# Patient Record
Sex: Female | Born: 1975 | Race: Black or African American | Hispanic: No | Marital: Single | Smoking: Never smoker
Health system: Southern US, Community
[De-identification: ages and names within clinical notes are randomized; demographics above are authoritative.]

## PROBLEM LIST (undated history)

## (undated) DIAGNOSIS — I214 Non-ST elevation (NSTEMI) myocardial infarction: Secondary | ICD-10-CM

## (undated) DIAGNOSIS — F419 Anxiety disorder, unspecified: Secondary | ICD-10-CM

## (undated) DIAGNOSIS — I10 Essential (primary) hypertension: Secondary | ICD-10-CM

## (undated) DIAGNOSIS — R7303 Prediabetes: Secondary | ICD-10-CM

## (undated) DIAGNOSIS — E7800 Pure hypercholesterolemia, unspecified: Secondary | ICD-10-CM

## (undated) HISTORY — PX: CORONARY ANGIOPLASTY WITH STENT PLACEMENT: SHX49

---

## 1997-07-11 ENCOUNTER — Ambulatory Visit (HOSPITAL_COMMUNITY): Admission: RE | Admit: 1997-07-11 | Discharge: 1997-07-11 | Payer: Self-pay | Admitting: Pulmonary Disease

## 1997-10-26 ENCOUNTER — Other Ambulatory Visit: Admission: RE | Admit: 1997-10-26 | Discharge: 1997-10-26 | Payer: Self-pay | Admitting: Pulmonary Disease

## 1997-12-05 ENCOUNTER — Encounter: Payer: Self-pay | Admitting: Pulmonary Disease

## 1997-12-05 ENCOUNTER — Ambulatory Visit (HOSPITAL_COMMUNITY): Admission: RE | Admit: 1997-12-05 | Discharge: 1997-12-05 | Payer: Self-pay | Admitting: Pulmonary Disease

## 2003-08-19 ENCOUNTER — Emergency Department (HOSPITAL_COMMUNITY): Admission: EM | Admit: 2003-08-19 | Discharge: 2003-08-19 | Payer: Self-pay | Admitting: Emergency Medicine

## 2006-12-25 ENCOUNTER — Emergency Department (HOSPITAL_COMMUNITY): Admission: EM | Admit: 2006-12-25 | Discharge: 2006-12-26 | Payer: Self-pay | Admitting: Emergency Medicine

## 2009-08-05 ENCOUNTER — Ambulatory Visit: Payer: Self-pay | Admitting: Psychology

## 2011-12-14 ENCOUNTER — Encounter (HOSPITAL_BASED_OUTPATIENT_CLINIC_OR_DEPARTMENT_OTHER): Payer: Self-pay | Admitting: *Deleted

## 2011-12-14 ENCOUNTER — Emergency Department (HOSPITAL_BASED_OUTPATIENT_CLINIC_OR_DEPARTMENT_OTHER)
Admission: EM | Admit: 2011-12-14 | Discharge: 2011-12-14 | Disposition: A | Discharge status: Home / Self Care | Payer: BC Managed Care – PPO | Attending: Emergency Medicine | Admitting: Emergency Medicine

## 2011-12-14 DIAGNOSIS — R209 Unspecified disturbances of skin sensation: Secondary | ICD-10-CM | POA: Insufficient documentation

## 2011-12-14 DIAGNOSIS — E7800 Pure hypercholesterolemia, unspecified: Secondary | ICD-10-CM | POA: Insufficient documentation

## 2011-12-14 DIAGNOSIS — R079 Chest pain, unspecified: Secondary | ICD-10-CM | POA: Insufficient documentation

## 2011-12-14 DIAGNOSIS — Z79899 Other long term (current) drug therapy: Secondary | ICD-10-CM | POA: Insufficient documentation

## 2011-12-14 HISTORY — DX: Pure hypercholesterolemia, unspecified: E78.00

## 2011-12-14 LAB — CBC WITH DIFFERENTIAL/PLATELET
Basophils Absolute: 0 10*3/uL (ref 0.0–0.1)
Basophils Relative: 0 % (ref 0–1)
Eosinophils Absolute: 0.1 10*3/uL (ref 0.0–0.7)
Eosinophils Relative: 1 % (ref 0–5)
HCT: 42.1 % (ref 36.0–46.0)
Hemoglobin: 14.2 g/dL (ref 12.0–15.0)
Lymphocytes Relative: 47 % — ABNORMAL HIGH (ref 12–46)
Lymphs Abs: 2.6 10*3/uL (ref 0.7–4.0)
MCH: 29.5 pg (ref 26.0–34.0)
MCHC: 33.7 g/dL (ref 30.0–36.0)
MCV: 87.3 fL (ref 78.0–100.0)
Monocytes Absolute: 0.4 10*3/uL (ref 0.1–1.0)
Monocytes Relative: 8 % (ref 3–12)
Neutro Abs: 2.4 10*3/uL (ref 1.7–7.7)
Neutrophils Relative %: 43 % (ref 43–77)
Platelets: 263 10*3/uL (ref 150–400)
RBC: 4.82 MIL/uL (ref 3.87–5.11)
RDW: 12.6 % (ref 11.5–15.5)
WBC: 5.5 10*3/uL (ref 4.0–10.5)

## 2011-12-14 LAB — COMPREHENSIVE METABOLIC PANEL
ALT: 54 U/L — ABNORMAL HIGH (ref 0–35)
Albumin: 4.4 g/dL (ref 3.5–5.2)
BUN: 8 mg/dL (ref 6–23)
Calcium: 9.7 mg/dL (ref 8.4–10.5)
GFR calc Af Amer: >90 mL/min (ref >90)
Glucose, Bld: 92 mg/dL (ref 70–99)
Potassium: 3.6 mEq/L (ref 3.5–5.1)
Sodium: 139 mEq/L (ref 135–145)
Total Protein: 7.9 g/dL (ref 6.0–8.3)

## 2011-12-14 LAB — TROPONIN I: Troponin I: <0.3 ng/mL (ref <0.30)

## 2011-12-14 LAB — D-DIMER, QUANTITATIVE: D-Dimer, Quant: <0.27 ug/mL-FEU (ref 0.00–0.48)

## 2011-12-14 MED ORDER — ASPIRIN 81 MG PO CHEW
324.0000 mg | CHEWABLE_TABLET | Freq: Once | ORAL | Status: AC
  Administered 2011-12-14: 324 mg via ORAL
  Filled 2011-12-14: qty 4

## 2011-12-14 NOTE — ED Provider Notes (Signed)
History     CSN: [id number]  Arrival date & time 12/14/11  1317   None     Chief Complaint  Patient presents with  . Chest Pain    (Consider location/radiation/quality/duration/timing/severity/associated sxs/prior treatment) Patient is a 36 y.o. female presenting with chest pain. The history is provided by the patient. No language interpreter was used.  Chest Pain Episode onset: 1 week. Duration of episode(s) is 1 week. Chest pain occurs intermittently. The chest pain is worsening. At its most intense, the pain is at 5/10. The quality of the pain is described as aching and brief. The pain does not radiate. Pertinent negatives for primary symptoms include no shortness of breath. She tried nothing for the symptoms. Risk factors: elevated cholesterol.  Pertinent negatives for family medical history include: no CAD in family.  Procedure history is negative for cardiac catheterization.     Past Medical History  Diagnosis Date  . High cholesterol     History reviewed. No pertinent past surgical history.  No family history on file.  History  Substance Use Topics  . Smoking status: Never Smoker   . Smokeless tobacco: Not on file  . Alcohol Use: Yes    OB History    Grav Para Term Preterm Abortions TAB SAB Ect Mult Living                  Review of Systems  Respiratory: Negative for shortness of breath.   Cardiovascular: Positive for chest pain.  All other systems reviewed and are negative.    Allergies  Review of patient's allergies indicates no known allergies.  Home Medications   Current Outpatient Rx  Name  Route  Sig  Dispense  Refill  . ROSUVASTATIN CALCIUM 10 MG PO TABS   Oral   Take 10 mg by mouth daily.           BP 142/105  Pulse 84  Temp 98.1 F (36.7 C) (Oral)  Resp 20  SpO2 100%  Physical Exam  Nursing note and vitals reviewed. Constitutional: She is oriented to person, place, and time. She appears well-developed and well-nourished.    HENT:  Head: Normocephalic and atraumatic.  Right Ear: External ear normal.  Left Ear: External ear normal.  Nose: Nose normal.  Mouth/Throat: Oropharynx is clear and moist.  Eyes: Conjunctivae normal and EOM are normal. Pupils are equal, round, and reactive to light.  Neck: Neck supple.  Cardiovascular: Normal rate, normal heart sounds and intact distal pulses.   Pulmonary/Chest: Effort normal and breath sounds normal.  Abdominal: Soft. Bowel sounds are normal.  Musculoskeletal: Normal range of motion.  Neurological: She is alert and oriented to person, place, and time. She has normal reflexes.  Skin: Skin is warm.    ED Course  Procedures (including critical care time)   Labs Reviewed  TROPONIN I  CBC WITH DIFFERENTIAL  COMPREHENSIVE METABOLIC PANEL   No results found.   1. Nonspecific chest pain       MDM   Date: 12/14/2011  Rate: 72  Rhythm: normal sinus rhythm  QRS Axis: normal  Intervals: normal  ST/T Wave abnormalities: nonspecific T wave changes  Conduction Disutrbances:none  Narrative Interpretation:   Old EKG Reviewed: unchanged  Troponin negative x 2.   Pt has no changes on EKg compared with old.  I advised pt to follow up with primary care for recheck.        Lonia Skinner [place], [place] 12/14/11 1633  Elson Areas, [place]  12/14/11 1712  Elson Areas, PA 12/14/11 [phone]

## 2011-12-14 NOTE — ED Notes (Addendum)
Was seen at [facility] this am for chest pain and was sent here for further evaluation. Left sided chest pain for a week. Tingling in her left arm off and on for a few months.

## 2011-12-16 NOTE — ED Provider Notes (Signed)
Medical screening examination/treatment/procedure(s) were performed by non-physician practitioner and as supervising physician I was immediately available for consultation/collaboration.   Rolan Bucco, MD 12/16/11 [phone]

## 2012-01-08 ENCOUNTER — Institutional Professional Consult (permissible substitution): Payer: BC Managed Care – PPO | Admitting: Cardiovascular Disease

## 2013-04-18 ENCOUNTER — Other Ambulatory Visit: Payer: Self-pay | Admitting: Internal Medicine

## 2013-04-18 DIAGNOSIS — N644 Mastodynia: Secondary | ICD-10-CM

## 2013-04-18 DIAGNOSIS — N6321 Unspecified lump in the left breast, upper outer quadrant: Secondary | ICD-10-CM

## 2013-04-18 DIAGNOSIS — M79622 Pain in left upper arm: Secondary | ICD-10-CM

## 2013-04-25 ENCOUNTER — Ambulatory Visit
Admission: RE | Admit: 2013-04-25 | Discharge: 2013-04-25 | Disposition: A | Discharge status: Home / Self Care | Payer: Self-pay | Source: Ambulatory Visit | Attending: Internal Medicine | Admitting: Internal Medicine

## 2013-04-25 ENCOUNTER — Ambulatory Visit
Admission: RE | Admit: 2013-04-25 | Discharge: 2013-04-25 | Disposition: A | Discharge status: Home / Self Care | Payer: BC Managed Care – PPO | Source: Ambulatory Visit | Attending: Internal Medicine | Admitting: Internal Medicine

## 2013-04-25 ENCOUNTER — Other Ambulatory Visit: Payer: Self-pay | Admitting: Internal Medicine

## 2013-04-25 DIAGNOSIS — N6321 Unspecified lump in the left breast, upper outer quadrant: Secondary | ICD-10-CM

## 2013-04-25 DIAGNOSIS — M79622 Pain in left upper arm: Secondary | ICD-10-CM

## 2013-11-07 ENCOUNTER — Other Ambulatory Visit: Payer: Self-pay | Admitting: Internal Medicine

## 2013-11-07 DIAGNOSIS — N6489 Other specified disorders of breast: Secondary | ICD-10-CM

## 2013-11-16 ENCOUNTER — Ambulatory Visit
Admission: RE | Admit: 2013-11-16 | Discharge: 2013-11-16 | Disposition: A | Discharge status: Home / Self Care | Payer: BC Managed Care – PPO | Source: Ambulatory Visit | Attending: Internal Medicine | Admitting: Internal Medicine

## 2013-11-16 ENCOUNTER — Encounter (INDEPENDENT_AMBULATORY_CARE_PROVIDER_SITE_OTHER): Payer: Self-pay

## 2013-11-16 DIAGNOSIS — N6489 Other specified disorders of breast: Secondary | ICD-10-CM

## 2015-05-24 ENCOUNTER — Other Ambulatory Visit: Payer: Self-pay | Admitting: Internal Medicine

## 2015-05-24 DIAGNOSIS — N6489 Other specified disorders of breast: Secondary | ICD-10-CM

## 2015-05-30 ENCOUNTER — Ambulatory Visit
Admission: RE | Admit: 2015-05-30 | Discharge: 2015-05-30 | Disposition: A | Discharge status: Home / Self Care | Payer: BC Managed Care – PPO | Source: Ambulatory Visit | Attending: Internal Medicine | Admitting: Internal Medicine

## 2015-05-30 DIAGNOSIS — N6489 Other specified disorders of breast: Secondary | ICD-10-CM

## 2016-06-30 ENCOUNTER — Other Ambulatory Visit: Payer: Self-pay | Admitting: Internal Medicine

## 2016-06-30 DIAGNOSIS — Z1231 Encounter for screening mammogram for malignant neoplasm of breast: Secondary | ICD-10-CM

## 2016-07-02 ENCOUNTER — Ambulatory Visit
Admission: RE | Admit: 2016-07-02 | Discharge: 2016-07-02 | Disposition: A | Discharge status: Home / Self Care | Payer: BC Managed Care – PPO | Source: Ambulatory Visit | Attending: Internal Medicine | Admitting: Internal Medicine

## 2016-07-02 DIAGNOSIS — Z1231 Encounter for screening mammogram for malignant neoplasm of breast: Secondary | ICD-10-CM

## 2016-11-06 ENCOUNTER — Other Ambulatory Visit (INDEPENDENT_AMBULATORY_CARE_PROVIDER_SITE_OTHER): Payer: Self-pay | Admitting: Otolaryngology

## 2016-11-06 DIAGNOSIS — J329 Chronic sinusitis, unspecified: Secondary | ICD-10-CM

## 2016-11-10 ENCOUNTER — Emergency Department (HOSPITAL_COMMUNITY)
Admission: EM | Admit: 2016-11-10 | Discharge: 2016-11-10 | Disposition: A | Discharge status: Home / Self Care | Payer: BC Managed Care – PPO | Attending: Emergency Medicine | Admitting: Emergency Medicine

## 2016-11-10 ENCOUNTER — Emergency Department (HOSPITAL_COMMUNITY): Payer: BC Managed Care – PPO

## 2016-11-10 ENCOUNTER — Encounter (HOSPITAL_COMMUNITY): Payer: Self-pay | Admitting: Emergency Medicine

## 2016-11-10 DIAGNOSIS — R0602 Shortness of breath: Secondary | ICD-10-CM | POA: Diagnosis not present

## 2016-11-10 DIAGNOSIS — R0789 Other chest pain: Secondary | ICD-10-CM | POA: Diagnosis not present

## 2016-11-10 DIAGNOSIS — J329 Chronic sinusitis, unspecified: Secondary | ICD-10-CM | POA: Diagnosis not present

## 2016-11-10 DIAGNOSIS — I10 Essential (primary) hypertension: Secondary | ICD-10-CM | POA: Insufficient documentation

## 2016-11-10 DIAGNOSIS — Z79899 Other long term (current) drug therapy: Secondary | ICD-10-CM | POA: Diagnosis not present

## 2016-11-10 DIAGNOSIS — E119 Type 2 diabetes mellitus without complications: Secondary | ICD-10-CM | POA: Insufficient documentation

## 2016-11-10 HISTORY — DX: Essential (primary) hypertension: I10

## 2016-11-10 LAB — BASIC METABOLIC PANEL
ANION GAP: 11 (ref 5–15)
BUN: 8 mg/dL (ref 6–20)
CALCIUM: 9.3 mg/dL (ref 8.9–10.3)
CO2: 23 mmol/L (ref 22–32)
CREATININE: 0.92 mg/dL (ref 0.44–1.00)
Chloride: 104 mmol/L (ref 101–111)
GFR calc Af Amer: >60 mL/min (ref >60)
GLUCOSE: 123 mg/dL — AB (ref 65–99)
Potassium: 3.4 mmol/L — ABNORMAL LOW (ref 3.5–5.1)
Sodium: 138 mmol/L (ref 135–145)

## 2016-11-10 LAB — POCT I-STAT TROPONIN I: TROPONIN I, POC: 0.02 ng/mL (ref 0.00–0.08)

## 2016-11-10 LAB — CBC
HEMATOCRIT: 41.8 % (ref 36.0–46.0)
Hemoglobin: 14.1 g/dL (ref 12.0–15.0)
MCH: 30.2 pg (ref 26.0–34.0)
MCHC: 33.7 g/dL (ref 30.0–36.0)
MCV: 89.5 fL (ref 78.0–100.0)
PLATELETS: 331 10*3/uL (ref 150–400)
RBC: 4.67 MIL/uL (ref 3.87–5.11)
RDW: 13.2 % (ref 11.5–15.5)
WBC: 5.4 10*3/uL (ref 4.0–10.5)

## 2016-11-10 LAB — I-STAT TROPONIN, ED: Troponin i, poc: 0.02 ng/mL (ref 0.00–0.08)

## 2016-11-10 LAB — POCT PREGNANCY, URINE: Preg Test, Ur: NEGATIVE

## 2016-11-10 NOTE — ED Triage Notes (Signed)
Patient reports having intermittent chest pain over past week and productive cough. Patient saw ENT and was given antibiotics and medications for URI. Patient reports today when walking in between buildings she got chest tightness that radiated to shoulders and some SOB.

## 2016-11-10 NOTE — ED Provider Notes (Signed)
[facility]-EMERGENCY DEPT Provider Note   CSN: [id number] Arrival date & time: 11/10/16  1540     History   Chief Complaint Chief Complaint  Patient presents with  . Chest Pain  . Cough    HPI Kristine Newman is a 41 y.o. female.  HPI  Kristine Newman is a 41 y.o. female with hx of htn, prediabetes, high cholesterol, presents to ED with complaint of chest pain and SOB. Pt states she has had symptoms for several weeks. States symptoms worse on exertion. Reports SOB, chest tightness that gets better with rest. No CP at rest. No SOB.  States recently has had a lot of congestion and sinus pressure with postnasal drainage. She has tried over-the-counter medicines for this with no help. She eventually went to ear nose throat and has a CT scan scheduled to look at her sinuses. She states the physician put her on Augmentin. She was also placed on inhaler for these symptoms. She states she did not use her inhaler today, used them once yesterday. She reports dry nonproductive cough for the same amount of time. Denies fever or chills. No pain in lower extremity. No recent travel or surgeries. Does not take any birth control. Does not smoke. No history of family heart problems. States she has been stressed and reports possible panic attacks.   Past Medical History:  Diagnosis Date  . Diabetes mellitus without complication (HCC)   . High cholesterol   . Hypertension     There are no active problems to display for this patient.   History reviewed. No pertinent surgical history.  OB History    No data available       Home Medications    Prior to Admission medications   Medication Sig Start Date End Date Taking? Authorizing Provider  amLODipine (NORVASC) 2.5 MG tablet Take 2.5 mg by mouth daily.   Yes [provider]  amoxicillin-clavulanate (AUGMENTIN) 875-125 MG tablet Take 1 tablet by mouth 2 (two) times daily. 11/04/16  Yes [provider]    fluticasone (FLONASE) 50 MCG/ACT nasal spray Place 2 sprays into both nostrils daily.   Yes [provider]  guaiFENesin (MUCINEX) 600 MG 12 hr tablet Take 600 mg by mouth 2 (two) times daily as needed for to loosen phlegm.   Yes [provider]  ipratropium (ATROVENT) 0.06 % nasal spray Place 2 sprays into the nose daily. 09/30/16  Yes [provider]  PROAIR HFA 108 (90 Base) MCG/ACT inhaler Take 2 puffs by mouth every 4 (four) hours as needed for wheezing or shortness of breath. 11/04/16  Yes [provider]  Vitamin D, Ergocalciferol, (DRISDOL) 50000 units CAPS capsule Take 50,000 Units by mouth once a week. 10/12/16  Yes [provider]    Family History Family History  Problem Relation Age of Onset  . Breast cancer Maternal Aunt     Social History Social History  Substance Use Topics  . Smoking status: Never Smoker  . Smokeless tobacco: Never Used  . Alcohol use Yes     Allergies   Crestor [rosuvastatin]   Review of Systems Review of Systems  Constitutional: Negative for chills and fever.  Respiratory: Positive for shortness of breath. Negative for cough and chest tightness.   Cardiovascular: Positive for chest pain. Negative for palpitations and leg swelling.  Gastrointestinal: Negative for abdominal pain, diarrhea, nausea and vomiting.  Genitourinary: Negative for dysuria, flank pain and pelvic pain.  Musculoskeletal: Negative for arthralgias, myalgias, neck  pain and neck stiffness.  Skin: Negative for rash.  Neurological: Negative for dizziness, weakness and headaches.  All other systems reviewed and are negative.    Physical Exam Updated Vital Signs BP (!) 135/102 (BP Location: Right Arm)   Pulse 88   Temp 98.1 F (36.7 C) (Oral)   Resp 14   Ht 5\' 3"  (1.6 m)   Wt 93 kg (205 lb)   SpO2 99%   BMI 36.31 kg/m   Physical Exam  Constitutional: She is oriented to person, place, and time. She appears well-developed  and well-nourished. No distress.  HENT:  Head: Normocephalic.  Mouth/Throat: Oropharynx is clear and moist.  Nasal congestion, post nasal drainage.   Eyes: Pupils are equal, round, and reactive to light. Conjunctivae and EOM are normal.  Neck: Neck supple.  Cardiovascular: Normal rate, regular rhythm and normal heart sounds.   Pulmonary/Chest: Effort normal and breath sounds normal. No respiratory distress. She has no wheezes. She has no rales.  Abdominal: Soft. Bowel sounds are normal. She exhibits no distension. There is no tenderness. There is no rebound.  Musculoskeletal: She exhibits no edema.  Neurological: She is alert and oriented to person, place, and time.  Skin: Skin is warm and dry.  Psychiatric: She has a normal mood and affect. Her behavior is normal.  Nursing note and vitals reviewed.    ED Treatments / Results  Labs (all labs ordered are listed, but only abnormal results are displayed) Labs Reviewed  BASIC METABOLIC PANEL - Abnormal; Notable for the following:       Result Value   Potassium 3.4 (*)    Glucose, Bld 123 (*)    All other components within normal limits  CBC  I-STAT TROPONIN, ED  POC URINE PREG, ED  I-STAT TROPONIN, ED  POCT I-STAT TROPONIN I  POCT PREGNANCY, URINE    EKG  EKG Interpretation None       Radiology Dg Chest 2 View  Result Date: 11/10/2016 CLINICAL DATA:  Chest pain EXAM: CHEST  2 VIEW COMPARISON:  None. FINDINGS: Lungs are clear. Heart size and pulmonary vascularity are normal. No adenopathy. No pneumothorax. No bone lesions. IMPRESSION: No edema or consolidation. Electronically Signed   By: Bretta Bang III M.D.   On: 11/10/2016 16:35    Procedures Procedures (including critical care time)  Medications Ordered in ED Medications - No data to display   Initial Impression / Assessment and Plan / ED Course  I have reviewed the triage vital signs and the nursing notes.  Pertinent labs & imaging results that were  available during my care of the patient were reviewed by me and considered in my medical decision making (see chart for details).     Patient with exertional shortness of breath and chest tightness, also recently has been battling with nasal congestion, sinus pressure, pain. Saw ENT, currently on Augmentin for sinusitis, and an inhaler. Patient reports a lot of postnasal drainage. Question whether this could be causing her symptoms. She is currently asymptomatic. Wells criteria and PERC negative. Doubt PE. ACS is on differential, initial troponin which was obtained in triage is normal. EKG is normal. Will repeat troponin, will monitor.   9:12 PM Repeat troponin is negative. Chest x-ray was clear. Patient continues to be symptomatic. She does admit to a lot of anxiety and panic attack in stress recently. She does have some risk factors for ACS, her heart score is 3. I will have her follow-up with cardiology for further evaluation  and stress test. Return precautions discussed. Will also increase her inhaler to every 4 hours, also discussed starting allergy medications daily for her nasal congestion and postnasal drainage.  Vitals:   11/10/16 1548 11/10/16 1854 11/10/16 2106  BP: (!) 168/107 (!) 146/84 (!) 135/102  Pulse: 89 92 88  Resp: 16 14 14   Temp: 98.3 F (36.8 C) 98.1 F (36.7 C)   TempSrc: Oral Oral   SpO2: 100% 100% 99%  Weight: 93 kg (205 lb)    Height: 5\' 3"  (1.6 m)       Final Clinical Impressions(s) / ED Diagnoses   Final diagnoses:  Atypical chest pain  Sinusitis, unspecified chronicity, unspecified location    New Prescriptions Discharge Medication List as of 11/10/2016  9:16 PM       Jaynie CrumbleKirichenko, Malene Blaydes, PA-C 11/11/16 0031    Alvira MondaySchlossman, Erin, MD 11/11/16 1336

## 2016-11-10 NOTE — Discharge Instructions (Signed)
Start allergy medication daily. Continue inhaler every 4 hrs for shortness of breath. Follow up with cardiology. Return if worsening symptoms.

## 2016-11-15 ENCOUNTER — Inpatient Hospital Stay (HOSPITAL_COMMUNITY)
Admission: EM | Admit: 2016-11-15 | Discharge: 2016-11-20 | DRG: 247 | Disposition: A | Discharge status: Home / Self Care | Payer: BC Managed Care – PPO | Attending: Cardiology | Admitting: Cardiology

## 2016-11-15 ENCOUNTER — Emergency Department (HOSPITAL_COMMUNITY): Payer: BC Managed Care – PPO

## 2016-11-15 ENCOUNTER — Encounter (HOSPITAL_COMMUNITY): Payer: Self-pay

## 2016-11-15 ENCOUNTER — Other Ambulatory Visit: Payer: Self-pay

## 2016-11-15 DIAGNOSIS — Z888 Allergy status to other drugs, medicaments and biological substances status: Secondary | ICD-10-CM | POA: Diagnosis not present

## 2016-11-15 DIAGNOSIS — I2510 Atherosclerotic heart disease of native coronary artery without angina pectoris: Secondary | ICD-10-CM | POA: Diagnosis present

## 2016-11-15 DIAGNOSIS — I10 Essential (primary) hypertension: Secondary | ICD-10-CM | POA: Diagnosis present

## 2016-11-15 DIAGNOSIS — Z6835 Body mass index (BMI) 35.0-35.9, adult: Secondary | ICD-10-CM | POA: Diagnosis not present

## 2016-11-15 DIAGNOSIS — Z8249 Family history of ischemic heart disease and other diseases of the circulatory system: Secondary | ICD-10-CM

## 2016-11-15 DIAGNOSIS — I214 Non-ST elevation (NSTEMI) myocardial infarction: Secondary | ICD-10-CM | POA: Diagnosis present

## 2016-11-15 DIAGNOSIS — Z79899 Other long term (current) drug therapy: Secondary | ICD-10-CM | POA: Diagnosis not present

## 2016-11-15 DIAGNOSIS — Z955 Presence of coronary angioplasty implant and graft: Secondary | ICD-10-CM

## 2016-11-15 DIAGNOSIS — Z7951 Long term (current) use of inhaled steroids: Secondary | ICD-10-CM

## 2016-11-15 DIAGNOSIS — I472 Ventricular tachycardia: Secondary | ICD-10-CM | POA: Diagnosis not present

## 2016-11-15 DIAGNOSIS — E1165 Type 2 diabetes mellitus with hyperglycemia: Secondary | ICD-10-CM | POA: Diagnosis present

## 2016-11-15 DIAGNOSIS — E785 Hyperlipidemia, unspecified: Secondary | ICD-10-CM | POA: Diagnosis present

## 2016-11-15 DIAGNOSIS — E669 Obesity, unspecified: Secondary | ICD-10-CM | POA: Diagnosis present

## 2016-11-15 DIAGNOSIS — R079 Chest pain, unspecified: Secondary | ICD-10-CM | POA: Diagnosis present

## 2016-11-15 DIAGNOSIS — I200 Unstable angina: Secondary | ICD-10-CM | POA: Diagnosis present

## 2016-11-15 HISTORY — DX: Prediabetes: R73.03

## 2016-11-15 HISTORY — DX: Anxiety disorder, unspecified: F41.9

## 2016-11-15 HISTORY — DX: Non-ST elevation (NSTEMI) myocardial infarction: I21.4

## 2016-11-15 LAB — BASIC METABOLIC PANEL
ANION GAP: 10 (ref 5–15)
BUN: 10 mg/dL (ref 6–20)
CO2: 22 mmol/L (ref 22–32)
Calcium: 8.9 mg/dL (ref 8.9–10.3)
Chloride: 103 mmol/L (ref 101–111)
Creatinine, Ser: 0.79 mg/dL (ref 0.44–1.00)
GFR calc Af Amer: >60 mL/min (ref >60)
GFR calc non Af Amer: >60 mL/min (ref >60)
GLUCOSE: 133 mg/dL — AB (ref 65–99)
POTASSIUM: 3.4 mmol/L — AB (ref 3.5–5.1)
SODIUM: 135 mmol/L (ref 135–145)

## 2016-11-15 LAB — CBC
HEMATOCRIT: 41.3 % (ref 36.0–46.0)
HEMOGLOBIN: 13.9 g/dL (ref 12.0–15.0)
MCH: 30.2 pg (ref 26.0–34.0)
MCHC: 33.7 g/dL (ref 30.0–36.0)
MCV: 89.8 fL (ref 78.0–100.0)
Platelets: 288 10*3/uL (ref 150–400)
RBC: 4.6 MIL/uL (ref 3.87–5.11)
RDW: 13.4 % (ref 11.5–15.5)
WBC: 6.8 10*3/uL (ref 4.0–10.5)

## 2016-11-15 LAB — I-STAT TROPONIN, ED
TROPONIN I, POC: 0.55 ng/mL — AB (ref 0.00–0.08)
Troponin i, poc: 0.1 ng/mL (ref 0.00–0.08)

## 2016-11-15 LAB — D-DIMER, QUANTITATIVE: D-Dimer, Quant: 18.48 ug/mL-FEU — ABNORMAL HIGH (ref 0.00–0.50)

## 2016-11-15 LAB — TROPONIN I
Troponin I: 1.28 ng/mL (ref <0.03)
Troponin I: 1.02 ng/mL (ref <0.03)

## 2016-11-15 LAB — PROTIME-INR
INR: 1.08
Prothrombin Time: 13.9 seconds (ref 11.4–15.2)

## 2016-11-15 LAB — MRSA PCR SCREENING: MRSA BY PCR: NEGATIVE

## 2016-11-15 LAB — HEPARIN LEVEL (UNFRACTIONATED): HEPARIN UNFRACTIONATED: 0.21 [IU]/mL — AB (ref 0.30–0.70)

## 2016-11-15 MED ORDER — ZOLPIDEM TARTRATE 5 MG PO TABS: 5.0000 mg | ORAL_TABLET | Freq: Every evening | ORAL | Status: DC | PRN

## 2016-11-15 MED ORDER — ATORVASTATIN CALCIUM 80 MG PO TABS: 80.0000 mg | ORAL_TABLET | Freq: Every day | ORAL | Status: DC

## 2016-11-15 MED ORDER — ACETAMINOPHEN 325 MG PO TABS: 650.0000 mg | ORAL_TABLET | ORAL | Status: DC | PRN

## 2016-11-15 MED ORDER — ALPRAZOLAM 0.25 MG PO TABS: 0.2500 mg | ORAL_TABLET | Freq: Two times a day (BID) | ORAL | Status: DC | PRN

## 2016-11-15 MED ORDER — NITROGLYCERIN IN D5W 200-5 MCG/ML-% IV SOLN
0.0000 ug/min | Freq: Once | INTRAVENOUS | Status: AC
  Administered 2016-11-15: 5 ug/min via INTRAVENOUS
  Filled 2016-11-15: qty 250

## 2016-11-15 MED ORDER — HEPARIN (PORCINE) IN NACL 100-0.45 UNIT/ML-% IJ SOLN
1150.0000 [IU]/h | INTRAMUSCULAR | Status: DC
  Administered 2016-11-15: 900 [IU]/h via INTRAVENOUS
  Administered 2016-11-16: 1000 [IU]/h via INTRAVENOUS
  Filled 2016-11-15 (×2): qty 250

## 2016-11-15 MED ORDER — TIROFIBAN HCL IV 12.5 MG/250 ML
0.0750 ug/kg/min | INTRAVENOUS | Status: DC
  Administered 2016-11-15 – 2016-11-16 (×2): 0.075 ug/kg/min via INTRAVENOUS
  Filled 2016-11-15: qty 250
  Filled 2016-11-15 (×2): qty 100

## 2016-11-15 MED ORDER — IOPAMIDOL (ISOVUE-370) INJECTION 76%
INTRAVENOUS | Status: AC
  Administered 2016-11-15: 100 mL
  Filled 2016-11-15: qty 100

## 2016-11-15 MED ORDER — ONDANSETRON HCL 4 MG/2ML IJ SOLN: 4.0000 mg | Freq: Four times a day (QID) | INTRAMUSCULAR | Status: DC | PRN

## 2016-11-15 MED ORDER — SODIUM CHLORIDE 0.9 % WEIGHT BASED INFUSION: 1.0000 mL/kg/h | INTRAVENOUS | Status: DC

## 2016-11-15 MED ORDER — NITROGLYCERIN 0.4 MG SL SUBL: 0.4000 mg | SUBLINGUAL_TABLET | SUBLINGUAL | Status: DC | PRN

## 2016-11-15 MED ORDER — HEPARIN BOLUS VIA INFUSION
4000.0000 [IU] | Freq: Once | INTRAVENOUS | Status: AC
  Administered 2016-11-15: 4000 [IU] via INTRAVENOUS
  Filled 2016-11-15: qty 4000

## 2016-11-15 MED ORDER — SODIUM CHLORIDE 0.9 % IV SOLN: 250.0000 mL | INTRAVENOUS | Status: DC | PRN

## 2016-11-15 MED ORDER — HEPARIN BOLUS VIA INFUSION
1000.0000 [IU] | Freq: Once | INTRAVENOUS | Status: AC
  Administered 2016-11-15: 1000 [IU] via INTRAVENOUS
  Filled 2016-11-15: qty 1000

## 2016-11-15 MED ORDER — AMLODIPINE BESYLATE 5 MG PO TABS
5.0000 mg | ORAL_TABLET | Freq: Every day | ORAL | Status: DC
  Administered 2016-11-15 – 2016-11-16 (×2): 5 mg via ORAL
  Filled 2016-11-15 (×2): qty 1

## 2016-11-15 MED ORDER — METOPROLOL TARTRATE 50 MG PO TABS
50.0000 mg | ORAL_TABLET | Freq: Two times a day (BID) | ORAL | Status: DC
  Administered 2016-11-15 – 2016-11-16 (×2): 50 mg via ORAL
  Filled 2016-11-15 (×2): qty 1

## 2016-11-15 MED ORDER — SODIUM CHLORIDE 0.9 % WEIGHT BASED INFUSION
3.0000 mL/kg/h | INTRAVENOUS | Status: AC
  Administered 2016-11-16: 3 mL/kg/h via INTRAVENOUS

## 2016-11-15 MED ORDER — METOPROLOL TARTRATE 5 MG/5ML IV SOLN
2.5000 mg | Freq: Once | INTRAVENOUS | Status: AC
  Administered 2016-11-15: 2.5 mg via INTRAVENOUS
  Filled 2016-11-15: qty 5

## 2016-11-15 MED ORDER — SODIUM CHLORIDE 0.9% FLUSH: 3.0000 mL | INTRAVENOUS | Status: DC | PRN

## 2016-11-15 MED ORDER — SODIUM CHLORIDE 0.9% FLUSH: 3.0000 mL | Freq: Two times a day (BID) | INTRAVENOUS | Status: DC

## 2016-11-15 MED ORDER — ASPIRIN EC 81 MG PO TBEC
81.0000 mg | DELAYED_RELEASE_TABLET | Freq: Every day | ORAL | Status: DC
  Administered 2016-11-16: 81 mg via ORAL
  Filled 2016-11-15: qty 1

## 2016-11-15 NOTE — Progress Notes (Signed)
ANTICOAGULATION CONSULT NOTE - Follow Up Consult  Pharmacy Consult for Heparin Indication: chest pain/ACS  Allergies  Allergen Reactions  . Crestor [Rosuvastatin]     "BODY ACHES"    Patient Measurements:   Heparin Dosing Weight: 73.7 kg  Vital Signs: Temp: 98.2 F (36.8 C) (10/28 0645) Temp Source: Oral (10/28 0645) BP: 151/102 (10/28 1600) Pulse Rate: 114 (10/28 1600)  Labs:  Recent Labs  11/15/16 0646 11/15/16 1442  HGB 13.9  --   HCT 41.3  --   PLT 288  --   HEPARINUNFRC  --  0.21*  CREATININE 0.79  --     Estimated Creatinine Clearance: 98.2 mL/min (by C-G formula based on SCr of 0.79 mg/dL).   Medical History: Past Medical History:  Diagnosis Date  . Diabetes mellitus without complication (HCC)   . High cholesterol   . Hypertension    Assessment: Kristine Newman is a 41 y/o F admitted with chest pain that was relieved by nitroglycerin while in the EMS. She also received 650 mg of ASA en route. Currently on IV heparin @ 900 units/hr. Initial HL is sub-therapeutic. CT angio ruled out PE.    Goal of Therapy:  Heparin level 0.3-0.7 units/ml Monitor platelets by anticoagulation protocol: Yes   Plan:  Heparin 1000 unit bolus x1 Increase heparin to 1000 units/hr infusion HL @ 2300  Daily HL, CBC, monitor for s/sx of bleeding  Vinnie LevelBenjamin Lin Hackmann, PharmD., BCPS Clinical Pharmacist Pager [phone]

## 2016-11-15 NOTE — ED Notes (Signed)
Per admitting Dr. Nadara EatonGangi to increase nitro drip to 30 mcg/min and reassess if chest pain is present

## 2016-11-15 NOTE — ED Notes (Signed)
Patient transported to Ct with cardiac monitor

## 2016-11-15 NOTE — ED Notes (Signed)
Attempted report. Per 3E patient not appropriate for their floor. Will notify admitting and charge RN

## 2016-11-15 NOTE — ED Provider Notes (Signed)
[facility] EMERGENCY DEPARTMENT Provider Note   CSN: [id number] Arrival date & time: 11/15/16  4098     History   Chief Complaint Chief Complaint  Patient presents with  . Chest Pain    HPI Kristine Newman is a 41 y.o. female.  HPI Patient presents with chest pain.  She has been having it over the last week.  Dull intermittent chest.  Comes and goes.  She had seen in the ER 4 days ago and had reassuring EKG x-ray and troponin at that time.  Outpatient cardiology follow-up is been arranged and is supposed to be in 2 days.  States that last night she had another episode.  It is in her mid chest.  It is dull.  Came on at rest today but has been coming on with exertion over the last week.  Not worse with breathing.  It is dull.  Has been on antibiotics for a sinus infection.  She took 2 full dose aspirin last night.  She was given nitroglycerin by EMS and it resolved the pain.  She is pain-free now.  She has high cholesterol hypertension and is "prediabetic". Past Medical History:  Diagnosis Date  . Diabetes mellitus without complication (HCC)   . High cholesterol   . Hypertension     There are no active problems to display for this patient.   No past surgical history on file.  OB History    No data available       Home Medications    Prior to Admission medications   Medication Sig Start Date End Date Taking? Authorizing Provider  amLODipine (NORVASC) 2.5 MG tablet Take 2.5 mg by mouth daily.    [provider]  amoxicillin-clavulanate (AUGMENTIN) 875-125 MG tablet Take 1 tablet by mouth 2 (two) times daily. 11/04/16   [provider]  fluticasone (FLONASE) 50 MCG/ACT nasal spray Place 2 sprays into both nostrils daily.    [provider]  guaiFENesin (MUCINEX) 600 MG 12 hr tablet Take 600 mg by mouth 2 (two) times daily as needed for to loosen phlegm.    [provider]  ipratropium (ATROVENT) 0.06 % nasal spray Place 2  sprays into the nose daily. 09/30/16   [provider]  PROAIR HFA 108 (90 Base) MCG/ACT inhaler Take 2 puffs by mouth every 4 (four) hours as needed for wheezing or shortness of breath. 11/04/16   [provider]  Vitamin D, Ergocalciferol, (DRISDOL) 50000 units CAPS capsule Take 50,000 Units by mouth once a week. 10/12/16   [provider]    Family History Family History  Problem Relation Age of Onset  . Breast cancer Maternal Aunt     Social History Social History  Substance Use Topics  . Smoking status: Never Smoker  . Smokeless tobacco: Never Used  . Alcohol use Yes     Allergies   Crestor [rosuvastatin]   Review of Systems Review of Systems  Constitutional: Negative for chills and fever.  HENT: Negative for congestion.   Respiratory: Positive for shortness of breath.   Cardiovascular: Positive for chest pain. Negative for leg swelling.  Gastrointestinal: Negative for abdominal pain.  Genitourinary: Negative for dysuria.  Musculoskeletal: Negative for back pain.  Skin: Negative for rash.  Neurological: Negative for seizures.  Hematological: Negative for adenopathy.  Psychiatric/Behavioral: Negative for confusion.     Physical Exam Updated Vital Signs BP (!) 151/91   Pulse (!) 108   Temp 98.2 F (36.8 C) (Oral)  Resp 19   SpO2 100%   Physical Exam  Constitutional: She appears well-developed.  HENT:  Head: Normocephalic.  Eyes: EOM are normal.  Neck: Neck supple.  Cardiovascular:  Tachycardia  Pulmonary/Chest: Effort normal.  Musculoskeletal: She exhibits no edema.  Neurological: She is alert.  Skin: Skin is warm. Capillary refill takes less than 2 seconds.  Psychiatric: She has a normal mood and affect.     ED Treatments / Results  Labs (all labs ordered are listed, but only abnormal results are displayed) Labs Reviewed  BASIC METABOLIC PANEL - Abnormal; Notable for the following:       Result Value   Potassium 3.4  (*)    Glucose, Bld 133 (*)    All other components within normal limits  D-DIMER, QUANTITATIVE (NOT AT [facility]) - Abnormal; Notable for the following:    D-Dimer, Quant 18.48 (*)    All other components within normal limits  I-STAT TROPONIN, ED - Abnormal; Notable for the following:    Troponin i, poc 0.10 (*)    All other components within normal limits  I-STAT TROPONIN, ED - Abnormal; Notable for the following:    Troponin i, poc 0.55 (*)    All other components within normal limits  CBC  HEPARIN LEVEL (UNFRACTIONATED)    EKG  EKG Interpretation  Date/Time:  Sunday November 15 2016 06:44:40 EDT Ventricular Rate:  93 PR Interval:  166 QRS Duration: 94 QT Interval:  366 QTC Calculation: 455 R Axis:   38 Text Interpretation:  Normal sinus rhythm Normal ECG Confirmed by Benjiman Core [phone]) on 11/15/2016 7:29:06 AM       EKG Interpretation  Date/Time:  Sunday November 15 2016 08:04:31 EDT Ventricular Rate:  115 PR Interval:  166 QRS Duration: 92 QT Interval:  339 QTC Calculation: 469 R Axis:   53 Text Interpretation:  Sinus tachycardia Borderline T wave abnormalities Confirmed by Benjiman Core [phone]) on 11/15/2016 8:11:05 AM Also confirmed by Benjiman Core [phone]), editor Barbette Hair [phone])  on 11/15/2016 8:55:40 AM        Radiology Dg Chest 2 View  Result Date: 11/15/2016 CLINICAL DATA:  Acute onset of central chest pain and nausea. EXAM: CHEST  2 VIEW COMPARISON:  Chest radiograph performed 11/10/2016 FINDINGS: The lungs are well-aerated and clear. There is no evidence of focal opacification, pleural effusion or pneumothorax. The heart is normal in size; the mediastinal contour is within normal limits. No acute osseous abnormalities are seen. IMPRESSION: No acute cardiopulmonary process seen. Electronically Signed   By: Roanna Raider M.D.   On: 11/15/2016 07:12   Ct Angio Chest Pe W And/or Wo Contrast  Result Date: 11/15/2016 CLINICAL DATA:   centralized chest pain and nausea for couple of weeks. EXAM: CT ANGIOGRAPHY CHEST WITH CONTRAST TECHNIQUE: Multidetector CT imaging of the chest was performed using the standard protocol during bolus administration of intravenous contrast. Multiplanar CT image reconstructions and MIPs were obtained to evaluate the vascular anatomy. CONTRAST:  100 mL  Isovue 370 IV COMPARISON:  None. FINDINGS: Cardiovascular: Satisfactory opacification of pulmonary arteries noted, and there is no evidence of pulmonary emboli. Adequate contrast opacification of the thoracic aorta with no evidence of dissection, aneurysm, or stenosis. Broad ductus diverticulum, an anatomic variant. There is classic 3-vessel brachiocephalic arch anatomy without proximal stenosis. Mediastinum/Nodes: No enlarged mediastinal, hilar, or axillary lymph nodes. Thyroid gland, trachea, and esophagus demonstrate no significant findings. Lungs/Pleura: No pleural effusion. No pneumothorax. Lungs are clear. Upper Abdomen: No acute findings. Musculoskeletal:  No chest wall abnormality. No acute or significant osseous findings. Review of the MIP images confirms the above findings. IMPRESSION: 1. Negative.  No acute PE or thoracic aortic dissection. Electronically Signed   By: Corlis Leak  Hassell M.D.   On: 11/15/2016 11:02    Procedures Procedures (including critical care time)  Medications Ordered in ED Medications  heparin ADULT infusion 100 units/mL (25000 units/22250mL sodium chloride 0.45%) (900 Units/hr Intravenous New Bag/Given 11/15/16 0831)  nitroGLYCERIN 50 mg in dextrose 5 % 250 mL (0.2 mg/mL) infusion (not administered)  heparin bolus via infusion 4,000 Units (4,000 Units Intravenous Bolus from Bag 11/15/16 0832)  metoprolol tartrate (LOPRESSOR) injection 2.5 mg (2.5 mg Intravenous Given 11/15/16 0826)  iopamidol (ISOVUE-370) 76 % injection (100 mLs  Contrast Given 11/15/16 1033)     Initial Impression / Assessment and Plan / ED Course  I have  reviewed the triage vital signs and the nursing notes.  Pertinent labs & imaging results that were available during my care of the patient were reviewed by me and considered in my medical decision making (see chart for details).     Patient with chest pain.  Has had on and off for the last couple weeks.  However d-dimer was very elevated.  Troponin is elevated.  Negative CT angiography of chest.  Seen by Dr. Jacinto HalimGanji in the ER.  Blood pressures been elevated.  Started on nitroglycerin and heparin.  Will admit to cardiologist.  CRITICAL CARE Performed by: Billee CashingPICKERING,Kenderick Kobler R. Total critical care time: 30 minutes Critical care time was exclusive of separately billable procedures and treating other patients. Critical care was necessary to treat or prevent imminent or life-threatening deterioration. Critical care was time spent personally by me on the following activities: development of treatment plan with patient and/or surrogate as well as nursing, discussions with consultants, evaluation of patient's response to treatment, examination of patient, obtaining history from patient or surrogate, ordering and performing treatments and interventions, ordering and review of laboratory studies, ordering and review of radiographic studies, pulse oximetry and re-evaluation of patient's condition.   Final Clinical Impressions(s) / ED Diagnoses   Final diagnoses:  NSTEMI (non-ST elevated myocardial infarction) [facility](HCC)    New Prescriptions New Prescriptions   No medications on file     Benjiman CorePickering, Makaveli Hoard, MD 11/15/16 1134

## 2016-11-15 NOTE — ED Notes (Signed)
Admitting aware of patient need for stepdown bed

## 2016-11-15 NOTE — ED Triage Notes (Signed)
Pt woke up at 5am with centralized chest pain and nausea. Pt took 325mg  x2 ASA and EMS gave 1 nitro. Pt reports chest pain is a 1/10 at present.

## 2016-11-15 NOTE — Progress Notes (Signed)
Critical Troponin 1.28. Notified Dr. Jacinto HalimGanji. No new CP at this time. Currently on Nitro and Heparin. Aggrostat ordered per MD. Pharmacy to dose. Will continue to monitor.

## 2016-11-15 NOTE — Progress Notes (Signed)
41 y/o F w/hypertension, hyperlipidemia, admitted with recurrent episodes of unstable angina. Trop now elevated and peaked at 1.2 ng/mL. She is diagnosed with NSTEMI and referred for coronary angiography and possible angioplasty.   LAD/Diag bifurcation stenosis LAD PCI 3.0 X 30 mm DES Diag PTCA Residual Diag and prox mid RCA disease  Medical management for now.  Full report to follow.

## 2016-11-15 NOTE — ED Notes (Signed)
External female catheter being used by pt.

## 2016-11-15 NOTE — Progress Notes (Signed)
ANTICOAGULATION CONSULT NOTE - Initial Consult  Pharmacy Consult for Heparin Indication: chest pain/ACS  Allergies  Allergen Reactions  . Crestor [Rosuvastatin]     "BODY ACHES"    Patient Measurements:   Heparin Dosing Weight: 73.7kg  Vital Signs: Temp: 98.2 F (36.8 C) (10/28 0645) Temp Source: Oral (10/28 0645) BP: 162/109 (10/28 0746) Pulse Rate: 116 (10/28 0747)  Labs:  Recent Labs  11/15/16 0646  HGB 13.9  HCT 41.3  PLT 288  CREATININE 0.79    Estimated Creatinine Clearance: 98.2 mL/min (by C-G formula based on SCr of 0.79 mg/dL).   Medical History: Past Medical History:  Diagnosis Date  . Diabetes mellitus without complication (HCC)   . High cholesterol   . Hypertension    Assessment: Kristine Newman is a 41 y/o F admitted with chest pain that was relieved by nitroglycerin while in the EMS. She also received 650 mg of ASA en route. Heparin was initiated, no known anticoagulation PTA.   [facility] for atypical chest pain where she was recommended for a cardiology follow up/stress test.   Goal of Therapy:  Heparin level 0.3-0.7 units/ml Monitor platelets by anticoagulation protocol: Yes   Plan:  Heparin 4000 unit bolus x1 Heparin 900 units/hr infusion HL at 1430 Daily HL, CBC, monitor for s/sx of bleeding  Nolen MuAustin J Lucas PharmD PGY1 Pharmacy Practice Resident 11/15/2016 8:09 AM Pager: [phone]

## 2016-11-15 NOTE — ED Notes (Signed)
CT notified to transport patient to Ct

## 2016-11-15 NOTE — ED Notes (Signed)
EDP aware of chest pain

## 2016-11-15 NOTE — ED Notes (Signed)
Pts meal tray arrived 

## 2016-11-15 NOTE — H&P (Signed)
Kristine Newman is an 41 y.o. female.   Chief Complaint: Chest pain and dyspnea HPI: Patient with hypertension, hyperlipidemia and hyperglycemia, obesity, who was evaluated in the emergency room about 4 days ago when she presented with upper respiratory tract infection-like presentation and also chest pain and shortness of breath.  She was ruled out for myocardial infarction and discharged home.  She presented again back to the emergency room, over the past 2 days she started having chest tightness in the middle of the chest without radiation, on and off, and got intense last evening.  She also noticed exertional dyspnea.  She went for a walk last evening with a friend, while walking she felt markedly dyspneic and also had some mild chest discomfort and dizzy and felt she was going to pass out.  She stopped walking and went back home.  Due to persistent symptoms, EMS was activated and she was brought to the emergency room.  Due to marked elevation in d-dimer, underwent CT angiogram of the chest which was negative for acute pulmonary embolism.  She is presently feeling better, states that the chest pain symptoms have improved since being here.  She also complains of palpitations, but states that she gets extremely anxious when she is in the hospital or with the doctors.  Past Medical History:  Diagnosis Date  . Diabetes mellitus without complication ([place])   . High cholesterol   . Hypertension     No past surgical history on file.  Family History  Problem Relation Age of Onset  . Breast cancer Maternal Aunt    Social History:  reports that she has never smoked. She has never used smokeless tobacco. She reports that she drinks alcohol. She reports that she does not use drugs.  Allergies:  Allergies  Allergen Reactions  . Crestor [Rosuvastatin]     "BODY ACHES"     (Not in a hospital admission)  Results for orders placed or performed during the hospital encounter of 11/15/16 (from the past  48 hour(s))  Basic metabolic panel     Status: Abnormal   Collection Time: 11/15/16  6:46 AM  Result Value Ref Range   Sodium 135 135 - 145 mmol/L   Potassium 3.4 (L) 3.5 - 5.1 mmol/L   Chloride 103 101 - 111 mmol/L   CO2 22 22 - 32 mmol/L   Glucose, Bld 133 (H) 65 - 99 mg/dL   BUN 10 6 - 20 mg/dL   Creatinine, Ser 0.79 0.44 - 1.00 mg/dL   Calcium 8.9 8.9 - 10.3 mg/dL   GFR calc non Af Amer >60 >60 mL/min   GFR calc Af Amer >60 >60 mL/min    Comment: (NOTE) The eGFR has been calculated using the CKD EPI equation. This calculation has not been validated in all clinical situations. eGFR's persistently <60 mL/min signify possible Chronic Kidney Disease.    Anion gap 10 5 - 15  CBC     Status: None   Collection Time: 11/15/16  6:46 AM  Result Value Ref Range   WBC 6.8 4.0 - 10.5 K/uL   RBC 4.60 3.87 - 5.11 MIL/uL   Hemoglobin 13.9 12.0 - 15.0 g/dL   HCT 41.3 36.0 - 46.0 %   MCV 89.8 78.0 - 100.0 fL   MCH 30.2 26.0 - 34.0 pg   MCHC 33.7 30.0 - 36.0 g/dL   RDW 13.4 11.5 - 15.5 %   Platelets 288 150 - 400 K/uL  I-stat troponin, ED  Status: Abnormal   Collection Time: 11/15/16  6:57 AM  Result Value Ref Range   Troponin i, poc 0.10 (HH) 0.00 - 0.08 ng/mL   Comment NOTIFIED PHYSICIAN    Comment 3            Comment: Due to the release kinetics of cTnI, a negative result within the first hours of the onset of symptoms does not rule out myocardial infarction with certainty. If myocardial infarction is still suspected, repeat the test at appropriate intervals.   D-dimer, quantitative (not at [facility])     Status: Abnormal   Collection Time: 11/15/16  7:49 AM  Result Value Ref Range   D-Dimer, Quant 18.48 (H) 0.00 - 0.50 ug/mL-FEU    Comment: (NOTE) At the manufacturer cut-off of 0.50 ug/mL FEU, this assay has been documented to exclude PE with a sensitivity and negative predictive value of 97 to 99%.  At this time, this assay has not been approved by the FDA to exclude  DVT/VTE. Results should be correlated with clinical presentation.   I-stat troponin, ED     Status: Abnormal   Collection Time: 11/15/16  9:59 AM  Result Value Ref Range   Troponin i, poc 0.55 (HH) 0.00 - 0.08 ng/mL   Comment NOTIFIED PHYSICIAN    Comment 3            Comment: Due to the release kinetics of cTnI, a negative result within the first hours of the onset of symptoms does not rule out myocardial infarction with certainty. If myocardial infarction is still suspected, repeat the test at appropriate intervals.    Dg Chest 2 View  Result Date: 11/15/2016 CLINICAL DATA:  Acute onset of central chest pain and nausea. EXAM: CHEST  2 VIEW COMPARISON:  Chest radiograph performed 11/10/2016 FINDINGS: The lungs are well-aerated and clear. There is no evidence of focal opacification, pleural effusion or pneumothorax. The heart is normal in size; the mediastinal contour is within normal limits. No acute osseous abnormalities are seen. IMPRESSION: No acute cardiopulmonary process seen. Electronically Signed   By: Garald Balding M.D.   On: 11/15/2016 07:12   Ct Angio Chest Pe W And/or Wo Contrast  Result Date: 11/15/2016 CLINICAL DATA:  centralized chest pain and nausea for couple of weeks. EXAM: CT ANGIOGRAPHY CHEST WITH CONTRAST TECHNIQUE: Multidetector CT imaging of the chest was performed using the standard protocol during bolus administration of intravenous contrast. Multiplanar CT image reconstructions and MIPs were obtained to evaluate the vascular anatomy. CONTRAST:  100 mL  Isovue 370 IV COMPARISON:  None. FINDINGS: Cardiovascular: Satisfactory opacification of pulmonary arteries noted, and there is no evidence of pulmonary emboli. Adequate contrast opacification of the thoracic aorta with no evidence of dissection, aneurysm, or stenosis. Broad ductus diverticulum, an anatomic variant. There is classic 3-vessel brachiocephalic arch anatomy without proximal stenosis. Mediastinum/Nodes:  No enlarged mediastinal, hilar, or axillary lymph nodes. Thyroid gland, trachea, and esophagus demonstrate no significant findings. Lungs/Pleura: No pleural effusion. No pneumothorax. Lungs are clear. Upper Abdomen: No acute findings. Musculoskeletal: No chest wall abnormality. No acute or significant osseous findings. Review of the MIP images confirms the above findings. IMPRESSION: 1. Negative.  No acute PE or thoracic aortic dissection. Electronically Signed   By: Lucrezia Europe M.D.   On: 11/15/2016 11:02    Review of Systems  Constitutional: Negative.   HENT: Negative.   Gastrointestinal: Negative.   Genitourinary: Negative.   Musculoskeletal: Negative.    chest pain present, palpitations present, shortness of  breath present, no leg edema, mild dizziness present, no hemoptysis, no recent long travel, no recent weight changes.  Negative for all other systems.  Blood pressure (!) 151/91, pulse (!) 108, temperature 98.2 F (36.8 C), temperature source Oral, resp. rate 19, SpO2 100 %. There is no height or weight on file to calculate BMI.  Physical Exam  Constitutional:  Moderately built and mildly obese in no acute distress.  HENT:  Head: Normocephalic and atraumatic.  Eyes: Conjunctivae are normal.  Neck: Normal range of motion. Neck supple.  Cardiovascular: Regular rhythm.  Exam reveals no gallop and no friction rub.   No murmur heard. Tachycardia present  Respiratory: Effort normal and breath sounds normal.  GI: Soft. Bowel sounds are normal.  Musculoskeletal: Normal range of motion.  Neurological: She is alert.  Skin: Skin is warm and dry.    EKG 11/07/2016: Sinus tachycardia at a rate of 113 bpm, no evidence of ischemia, normal EKG otherwise.  Assessment/Plan 1.  Exertional chest pain associated with shortness of breath concerning for angina pectoris, serum troponin elevated indicating of unstable angina pectoris, possible non-STEMI. 2.  Hypertension 3.  Hyperlipidemia 4.   Hyperglycemia with  Recommendation: Patient symptoms of chest pain have improved, continue IV heparin, start IV nitroglycerin, will add beta-blocker for tachycardia along with blood pressure control, follow-up on labs including serum troponin.  I will schedule her for  left heart catheterization to evaluate her dyspnea and also chest pain.  Her sister is present at the bedside.  Discussed risks, benefits and alternatives of angiogram including but not limited to <1% risk of death, stroke, MI, need for urgent surgical revascularization, renal failure, but not limited to thest. patient is willing to proceed.  Unless she continues to have worsening chest pain or new EKG changes, we will schedule it for tomorrow morning.   Adrian Prows, MD 11/15/2016, 11:28 AM

## 2016-11-16 ENCOUNTER — Inpatient Hospital Stay (HOSPITAL_COMMUNITY)
Admission: EM | Disposition: A | Discharge status: Home / Self Care | Payer: Self-pay | Source: Home / Self Care | Attending: Cardiology

## 2016-11-16 ENCOUNTER — Other Ambulatory Visit: Payer: Self-pay

## 2016-11-16 ENCOUNTER — Encounter (HOSPITAL_COMMUNITY): Payer: Self-pay | Admitting: Cardiology

## 2016-11-16 ENCOUNTER — Inpatient Hospital Stay (HOSPITAL_COMMUNITY): Payer: BC Managed Care – PPO

## 2016-11-16 HISTORY — PX: LEFT HEART CATH AND CORONARY ANGIOGRAPHY: CATH118249

## 2016-11-16 HISTORY — PX: CORONARY BALLOON ANGIOPLASTY: CATH118233

## 2016-11-16 HISTORY — PX: CORONARY STENT INTERVENTION: CATH118234

## 2016-11-16 LAB — ECHOCARDIOGRAM COMPLETE
E decel time: 134 ms
E/e' ratio: 14.42
FS: 27 % — AB (ref 28–44)
Height: 63 in
IVS/LV PW RATIO, ED: 0.99
LA ID, A-P, ES: 36 mm
LA diam end sys: 36 mm
LA diam index: 1.87 cm/m2
LA vol A4C: 33.1 mL
LA vol index: 19.9 mL/m2
LA vol: 38.5 mL
LV E/e' medial: 14.42
LV E/e'average: 14.42
LV PW d: 10.3 mm — AB (ref 0.6–1.1)
LV e' LATERAL: 6.09 cm/s
LVOT SV: 70 mL
LVOT VTI: 22.2 cm
LVOT area: 3.14 cm2
LVOT diameter: 20 mm
LVOT peak vel: 92.4 cm/s
Lateral S' vel: 9.14 cm/s
MV Dec: 134
MV Peak grad: 3 mmHg
MV pk A vel: 90.7 m/s
MV pk E vel: 87.8 m/s
TAPSE: 17.8 mm
TDI e' lateral: 6.09
TDI e' medial: 8.16
Weight: 3185.6 [oz_av]

## 2016-11-16 LAB — LIPID PANEL
CHOL/HDL RATIO: 6.1 ratio
CHOLESTEROL: 275 mg/dL — AB (ref 0–200)
HDL: 45 mg/dL (ref >40)
LDL CALC: 190 mg/dL — AB (ref 0–99)
TRIGLYCERIDES: 199 mg/dL — AB (ref <150)
VLDL: 40 mg/dL (ref 0–40)

## 2016-11-16 LAB — TROPONIN I
TROPONIN I: 0.8 ng/mL — AB (ref <0.03)
Troponin I: 0.4 ng/mL

## 2016-11-16 LAB — TSH: TSH: 5.6 u[IU]/mL — ABNORMAL HIGH (ref 0.350–4.500)

## 2016-11-16 LAB — GLUCOSE, CAPILLARY
GLUCOSE-CAPILLARY: 75 mg/dL (ref 65–99)
Glucose-Capillary: 121 mg/dL — ABNORMAL HIGH (ref 65–99)
Glucose-Capillary: 71 mg/dL (ref 65–99)

## 2016-11-16 LAB — HEPARIN LEVEL (UNFRACTIONATED)
Heparin Unfractionated: 0.23 IU/mL — ABNORMAL LOW (ref 0.30–0.70)
Heparin Unfractionated: 0.26 IU/mL — ABNORMAL LOW (ref 0.30–0.70)

## 2016-11-16 LAB — POCT ACTIVATED CLOTTING TIME
ACTIVATED CLOTTING TIME: 456 s
Activated Clotting Time: 593 seconds
Activated Clotting Time: 549 seconds

## 2016-11-16 LAB — BASIC METABOLIC PANEL
Anion gap: 9 (ref 5–15)
BUN: 8 mg/dL (ref 6–20)
CALCIUM: 9 mg/dL (ref 8.9–10.3)
CHLORIDE: 108 mmol/L (ref 101–111)
CO2: 21 mmol/L — AB (ref 22–32)
CREATININE: 0.84 mg/dL (ref 0.44–1.00)
GFR calc non Af Amer: >60 mL/min (ref >60)
GLUCOSE: 95 mg/dL (ref 65–99)
Potassium: 3.6 mmol/L (ref 3.5–5.1)
Sodium: 138 mmol/L (ref 135–145)

## 2016-11-16 LAB — CBC
HCT: 43.1 % (ref 36.0–46.0)
Hemoglobin: 14.5 g/dL (ref 12.0–15.0)
MCH: 30.1 pg (ref 26.0–34.0)
MCHC: 33.6 g/dL (ref 30.0–36.0)
MCV: 89.6 fL (ref 78.0–100.0)
Platelets: 315 10*3/uL (ref 150–400)
RBC: 4.81 MIL/uL (ref 3.87–5.11)
RDW: 13.4 % (ref 11.5–15.5)
WBC: 10.1 10*3/uL (ref 4.0–10.5)

## 2016-11-16 LAB — HEMOGLOBIN A1C
Hgb A1c MFr Bld: 6.3 % — ABNORMAL HIGH (ref 4.8–5.6)
Mean Plasma Glucose: 134.11 mg/dL

## 2016-11-16 LAB — HIV ANTIBODY (ROUTINE TESTING W REFLEX): HIV Screen 4th Generation wRfx: NONREACTIVE

## 2016-11-16 SURGERY — LEFT HEART CATH AND CORONARY ANGIOGRAPHY
Anesthesia: LOCAL

## 2016-11-16 MED ORDER — FENTANYL CITRATE (PF) 100 MCG/2ML IJ SOLN
INTRAMUSCULAR | Status: DC | PRN
  Administered 2016-11-16 (×4): 25 ug via INTRAVENOUS

## 2016-11-16 MED ORDER — MIDAZOLAM HCL 2 MG/2ML IJ SOLN
INTRAMUSCULAR | Status: AC
  Filled 2016-11-16: qty 2

## 2016-11-16 MED ORDER — MIDAZOLAM HCL 2 MG/2ML IJ SOLN
INTRAMUSCULAR | Status: DC | PRN
  Administered 2016-11-16 (×3): 1 mg via INTRAVENOUS

## 2016-11-16 MED ORDER — HYDRALAZINE HCL 20 MG/ML IJ SOLN
5.0000 mg | INTRAMUSCULAR | Status: AC | PRN
Start: 2016-11-16 — End: 2016-11-16

## 2016-11-16 MED ORDER — FENTANYL CITRATE (PF) 100 MCG/2ML IJ SOLN
25.0000 ug | Freq: Four times a day (QID) | INTRAMUSCULAR | Status: DC | PRN
  Filled 2016-11-16: qty 2

## 2016-11-16 MED ORDER — IOPAMIDOL (ISOVUE-370) INJECTION 76%
INTRAVENOUS | Status: AC
  Filled 2016-11-16: qty 50

## 2016-11-16 MED ORDER — HEPARIN SODIUM (PORCINE) 1000 UNIT/ML IJ SOLN
INTRAMUSCULAR | Status: DC | PRN
  Administered 2016-11-16: 4000 [IU] via INTRAVENOUS
  Administered 2016-11-16: 5000 [IU] via INTRAVENOUS

## 2016-11-16 MED ORDER — NITROGLYCERIN 1 MG/10 ML FOR IR/CATH LAB
INTRA_ARTERIAL | Status: AC
  Filled 2016-11-16: qty 10

## 2016-11-16 MED ORDER — FENTANYL CITRATE (PF) 100 MCG/2ML IJ SOLN
INTRAMUSCULAR | Status: AC
  Filled 2016-11-16: qty 2

## 2016-11-16 MED ORDER — THE SENSUOUS HEART BOOK
Freq: Once | Status: AC
Start: 2016-11-16 — End: 2016-11-16
  Administered 2016-11-16: 21:00:00
  Filled 2016-11-16: qty 1

## 2016-11-16 MED ORDER — LIDOCAINE HCL 2 % IJ SOLN
INTRAMUSCULAR | Status: DC | PRN
  Administered 2016-11-16: 2 mL via INTRADERMAL

## 2016-11-16 MED ORDER — ANGIOPLASTY BOOK
Freq: Once | Status: AC
Start: 2016-11-16 — End: 2016-11-16
  Administered 2016-11-16: 21:00:00
  Filled 2016-11-16: qty 1

## 2016-11-16 MED ORDER — HEPARIN SODIUM (PORCINE) 1000 UNIT/ML IJ SOLN
INTRAMUSCULAR | Status: AC
  Filled 2016-11-16: qty 1

## 2016-11-16 MED ORDER — NITROGLYCERIN 1 MG/10 ML FOR IR/CATH LAB
INTRA_ARTERIAL | Status: DC | PRN
  Administered 2016-11-16 (×2): 200 ug via INTRACORONARY

## 2016-11-16 MED ORDER — IOPAMIDOL (ISOVUE-370) INJECTION 76%
INTRAVENOUS | Status: AC
  Filled 2016-11-16: qty 100

## 2016-11-16 MED ORDER — ONDANSETRON HCL 4 MG/2ML IJ SOLN
INTRAMUSCULAR | Status: DC | PRN
  Administered 2016-11-16: 4 mg via INTRAVENOUS

## 2016-11-16 MED ORDER — SODIUM CHLORIDE 0.9% FLUSH: 3.0000 mL | INTRAVENOUS | Status: DC | PRN

## 2016-11-16 MED ORDER — SODIUM CHLORIDE 0.9 % IV SOLN
INTRAVENOUS | Status: AC
  Administered 2016-11-16: 15:00:00 via INTRAVENOUS

## 2016-11-16 MED ORDER — TICAGRELOR 90 MG PO TABS
90.0000 mg | ORAL_TABLET | Freq: Two times a day (BID) | ORAL | Status: DC
  Administered 2016-11-16 – 2016-11-20 (×7): 90 mg via ORAL
  Filled 2016-11-16 (×7): qty 1

## 2016-11-16 MED ORDER — ONDANSETRON HCL 4 MG/2ML IJ SOLN
INTRAMUSCULAR | Status: AC
  Filled 2016-11-16: qty 2

## 2016-11-16 MED ORDER — LIDOCAINE HCL 2 % IJ SOLN
INTRAMUSCULAR | Status: AC
  Filled 2016-11-16: qty 20

## 2016-11-16 MED ORDER — SODIUM CHLORIDE 0.9% FLUSH
3.0000 mL | Freq: Two times a day (BID) | INTRAVENOUS | Status: DC
  Administered 2016-11-17 – 2016-11-18 (×2): 3 mL via INTRAVENOUS

## 2016-11-16 MED ORDER — TICAGRELOR 90 MG PO TABS
ORAL_TABLET | ORAL | Status: DC | PRN
  Administered 2016-11-16: 180 mg via ORAL

## 2016-11-16 MED ORDER — ACETAMINOPHEN 325 MG PO TABS
650.0000 mg | ORAL_TABLET | ORAL | Status: DC | PRN
  Administered 2016-11-16 (×3): 650 mg via ORAL
  Filled 2016-11-16 (×3): qty 2

## 2016-11-16 MED ORDER — VERAPAMIL HCL 2.5 MG/ML IV SOLN
INTRAVENOUS | Status: DC | PRN
  Administered 2016-11-16: 5 mL via INTRA_ARTERIAL

## 2016-11-16 MED ORDER — ASPIRIN EC 81 MG PO TBEC
81.0000 mg | DELAYED_RELEASE_TABLET | Freq: Every day | ORAL | Status: DC
  Administered 2016-11-17: 10:00:00 81 mg via ORAL
  Filled 2016-11-16: qty 1

## 2016-11-16 MED ORDER — HEPARIN (PORCINE) IN NACL 2-0.9 UNIT/ML-% IJ SOLN
INTRAMUSCULAR | Status: AC | PRN
  Administered 2016-11-16: 1000 mL

## 2016-11-16 MED ORDER — ONDANSETRON HCL 4 MG/2ML IJ SOLN: 4.0000 mg | Freq: Four times a day (QID) | INTRAMUSCULAR | Status: DC | PRN

## 2016-11-16 MED ORDER — LABETALOL HCL 5 MG/ML IV SOLN: 10.0000 mg | INTRAVENOUS | Status: AC | PRN

## 2016-11-16 MED ORDER — HEART ATTACK BOUNCING BOOK
Freq: Once | Status: AC
Start: 2016-11-16 — End: 2016-11-16
  Administered 2016-11-16: 21:00:00
  Filled 2016-11-16: qty 1

## 2016-11-16 MED ORDER — METOPROLOL TARTRATE 25 MG PO TABS
25.0000 mg | ORAL_TABLET | Freq: Two times a day (BID) | ORAL | Status: DC
  Administered 2016-11-16: 21:00:00 25 mg via ORAL
  Filled 2016-11-16: qty 1

## 2016-11-16 MED ORDER — TICAGRELOR 90 MG PO TABS
ORAL_TABLET | ORAL | Status: AC
  Filled 2016-11-16: qty 2

## 2016-11-16 MED ORDER — SODIUM CHLORIDE 0.9 % IV SOLN
250.0000 mL | INTRAVENOUS | Status: DC | PRN
Start: 2016-11-16 — End: 2016-11-20

## 2016-11-16 MED ORDER — HEPARIN (PORCINE) IN NACL 2-0.9 UNIT/ML-% IJ SOLN
INTRAMUSCULAR | Status: AC
  Filled 2016-11-16: qty 1000

## 2016-11-16 SURGICAL SUPPLY — 18 items
BALLN SAPPHIRE 2.5X15 (BALLOONS) ×4
BALLN SAPPHIRE ~~LOC~~ 2.5X8 (BALLOONS) ×2
BALLN SAPPHIRE ~~LOC~~ 3.5X15 (BALLOONS) ×2
CATH LAUNCHER 6FR JL3 (CATHETERS) ×2
CATH OPTITORQUE TIG 4.0 5F (CATHETERS) ×2
DEVICE RAD COMP TR BAND LRG (VASCULAR PRODUCTS) ×2
GLIDESHEATH SLEND A-KIT 6F 22G (SHEATH) ×2
GLIDESHEATH SLEND SS 6F .021 (SHEATH)
INQWIRE 1.5J .035X260CM (WIRE) ×2
KIT ENCORE 26 ADVANTAGE (KITS) ×4
KIT HEART LEFT (KITS) ×2
PACK CARDIAC CATHETERIZATION (CUSTOM PROCEDURE TRAY) ×2
STENT RESOLUTE ONYX 3.0X30 (Permanent Stent) ×2 IMPLANT
TRANSDUCER W/STOPCOCK (MISCELLANEOUS) ×2
TUBING CIL FLEX 10 FLL-RA (TUBING) ×2
VALVE GUARDIAN II ~~LOC~~ HEMO (MISCELLANEOUS) ×2
WIRE COUGAR XT STRL 190CM (WIRE) ×6
WIRE MARVEL STR TIP 190CM (WIRE) ×2

## 2016-11-16 NOTE — Interval H&P Note (Signed)
History and Physical Interval Note:  11/16/2016 10:13 AM  Tomasa RandIyanna Newcomer  has presented today for surgery, with the diagnosis of NSTEMI  The various methods of treatment have been discussed with the patient and family. After consideration of risks, benefits and other options for treatment, the patient has consented to  Procedure(s): LEFT HEART CATH AND CORONARY ANGIOGRAPHY (N/A) as a surgical intervention .  The patient's history has been reviewed, patient examined, no change in status, stable for surgery.  I have reviewed the patient's chart and labs.  Questions were answered to the patient's satisfaction.    Cath Lab Visit (complete for each Cath Lab visit)  Clinical Evaluation Leading to the Procedure:   ACS: Yes.    Non-ACS:    Anginal Classification: CCS IV  Anti-ischemic medical therapy: Maximal Therapy (2 or more classes of medications)  Non-Invasive Test Results: No non-invasive testing performed  Prior CABG: No previous CABG    Yariel Ferraris J Najir Roop

## 2016-11-16 NOTE — Care Management Note (Addendum)
Case Management Note  Patient Details  Name: Kristine Newman MRN: [id number] Date of Birth: [birth year]  Subjective/Objective:     Chest pain, s/p PCI               Action/Plan: NCM spoke to pt at bedside. Provided pt with Brilinta copay card for $5 at her pharmacy. Contacted [facility] on [address] and they do have Brilinta in stock.    5. S/W GINGER @ [facility] # [phone]   BRILINTA 90 MG BID   COVER- YES  CO-PAY- $ 30.00  PRIOR APPROVAL- NO   PREFERRED PHARMACY : [facility]  Expected Discharge Date:                  Expected Discharge Plan:  Home/Self Care  In-House Referral:  NA  Discharge planning Services  CM Consult, Medication Assistance  Post Acute Care Choice:  NA Choice offered to:  NA  DME Arranged:  N/A DME Agency:  NA  HH Arranged:  NA HH Agency:  NA  Status of Service:  Completed, signed off  If discussed at Long Length of Stay Meetings, dates discussed:    Additional Comments:  Elliot CousinShavis, Yarden Manuelito Ellen, RN 11/16/2016, 4:53 PM

## 2016-11-16 NOTE — Progress Notes (Signed)
ANTICOAGULATION CONSULT NOTE - Follow Up Consult  Pharmacy Consult for Heparin  Indication: chest pain/ACS  Allergies  Allergen Reactions  . Crestor [Rosuvastatin]     "BODY ACHES"    Patient Measurements: Height: 5\' 3"  (160 cm) Weight: 199 lb 8 oz (90.5 kg) IBW/kg (Calculated) : 52.4  Vital Signs: Temp: 99 F (37.2 C) (10/29 0018) Temp Source: Oral (10/29 0018) BP: 140/98 (10/29 0018) Pulse Rate: 77 (10/29 0018)  Labs:  Recent Labs  11/15/16 0646 11/15/16 1442 11/15/16 1822 11/15/16 2120 11/16/16 0229  HGB 13.9  --   --   --  14.5  HCT 41.3  --   --   --  43.1  PLT 288  --   --   --  315  LABPROT  --   --   --  13.9  --   INR  --   --   --  1.08  --   HEPARINUNFRC  --  0.21*  --   --  0.23*  CREATININE 0.79  --   --   --   --   TROPONINI  --   --  1.28* 1.02*  --     Estimated Creatinine Clearance: 96.8 mL/min (by C-G formula based on SCr of 0.79 mg/dL).   Assessment: Heparin for CP/elevated trop, heparin level sub-therapeutic this AM, no issues per RN.   Goal of Therapy:  Heparin level 0.3-0.7 units/ml Monitor platelets by anticoagulation protocol: Yes   Plan:  Inc heparin to 1150 units/hr 1200 HL  Kavon Valenza 11/16/2016,3:28 AM

## 2016-11-16 NOTE — Progress Notes (Signed)
TR BAND REMOVAL  LOCATION:    right radial  DEFLATED PER PROTOCOL:    Yes.    TIME BAND OFF / DRESSING APPLIED:    1620   SITE UPON ARRIVAL:    Level 0  SITE AFTER BAND REMOVAL:    Level 0  CIRCULATION SENSATION AND MOVEMENT:    Within Normal Limits   Yes.    COMMENTS:   Tolerated procedure well 

## 2016-11-16 NOTE — Plan of Care (Signed)
Problem: Physical Regulation: Goal: Ability to maintain clinical measurements within normal limits will improve Outcome: Progressing Pt currently denies any CP. VSS. Infusions running at set rate. Sites prepped for heart cath, EKG performed. Consent signed and placed in chart. Will continue to monitor.

## 2016-11-16 NOTE — Progress Notes (Signed)
  Echocardiogram 2D Echocardiogram has been performed.  Kristine SavoyCasey N Krystalyn Kubota 11/16/2016, 8:40 AM

## 2016-11-17 ENCOUNTER — Ambulatory Visit: Payer: BC Managed Care – PPO | Admitting: Cardiovascular Disease

## 2016-11-17 ENCOUNTER — Encounter (HOSPITAL_COMMUNITY): Payer: Self-pay | Admitting: General Practice

## 2016-11-17 LAB — BASIC METABOLIC PANEL
ANION GAP: 11 (ref 5–15)
BUN: 8 mg/dL (ref 6–20)
CALCIUM: 9.1 mg/dL (ref 8.9–10.3)
CO2: 20 mmol/L — AB (ref 22–32)
Chloride: 106 mmol/L (ref 101–111)
Creatinine, Ser: 0.84 mg/dL (ref 0.44–1.00)
Glucose, Bld: 89 mg/dL (ref 65–99)
Potassium: 3.5 mmol/L (ref 3.5–5.1)
SODIUM: 137 mmol/L (ref 135–145)

## 2016-11-17 LAB — GLUCOSE, CAPILLARY
GLUCOSE-CAPILLARY: 101 mg/dL — AB (ref 65–99)
GLUCOSE-CAPILLARY: 82 mg/dL (ref 65–99)
GLUCOSE-CAPILLARY: 86 mg/dL (ref 65–99)
Glucose-Capillary: 88 mg/dL (ref 65–99)

## 2016-11-17 LAB — HEPARIN LEVEL (UNFRACTIONATED): HEPARIN UNFRACTIONATED: 0.32 [IU]/mL (ref 0.30–0.70)

## 2016-11-17 LAB — CBC
HEMATOCRIT: 41.7 % (ref 36.0–46.0)
Hemoglobin: 13.9 g/dL (ref 12.0–15.0)
MCH: 30.3 pg (ref 26.0–34.0)
MCHC: 33.3 g/dL (ref 30.0–36.0)
MCV: 90.8 fL (ref 78.0–100.0)
PLATELETS: 277 10*3/uL (ref 150–400)
RBC: 4.59 MIL/uL (ref 3.87–5.11)
RDW: 13.6 % (ref 11.5–15.5)
WBC: 9.1 10*3/uL (ref 4.0–10.5)

## 2016-11-17 LAB — TROPONIN I: TROPONIN I: 5.84 ng/mL — AB (ref <0.03)

## 2016-11-17 MED ORDER — LISINOPRIL 10 MG PO TABS
10.0000 mg | ORAL_TABLET | Freq: Every day | ORAL | Status: DC
  Administered 2016-11-17 – 2016-11-18 (×2): 10 mg via ORAL
  Filled 2016-11-17 (×2): qty 1

## 2016-11-17 MED ORDER — SODIUM CHLORIDE 0.9 % IV SOLN: INTRAVENOUS | Status: AC

## 2016-11-17 MED ORDER — METOPROLOL TARTRATE 50 MG PO TABS: 50.0000 mg | ORAL_TABLET | Freq: Two times a day (BID) | ORAL | 3 refills | Status: DC

## 2016-11-17 MED ORDER — ATORVASTATIN CALCIUM 80 MG PO TABS
80.0000 mg | ORAL_TABLET | Freq: Every day | ORAL | Status: DC
  Administered 2016-11-17 – 2016-11-19 (×3): 80 mg via ORAL
  Filled 2016-11-17 (×4): qty 1

## 2016-11-17 MED ORDER — HEPARIN (PORCINE) IN NACL 100-0.45 UNIT/ML-% IJ SOLN
1200.0000 [IU]/h | INTRAMUSCULAR | Status: DC
  Administered 2016-11-17: 1150 [IU]/h via INTRAVENOUS
  Administered 2016-11-18: 1200 [IU]/h via INTRAVENOUS
  Filled 2016-11-17 (×2): qty 250

## 2016-11-17 MED ORDER — SODIUM CHLORIDE 0.9 % IV SOLN
INTRAVENOUS | Status: AC
  Administered 2016-11-17: 11:00:00 via INTRAVENOUS

## 2016-11-17 MED ORDER — ASPIRIN 81 MG PO CHEW
81.0000 mg | CHEWABLE_TABLET | Freq: Every day | ORAL | Status: DC
  Administered 2016-11-17 – 2016-11-20 (×4): 81 mg via ORAL
  Filled 2016-11-17 (×4): qty 1

## 2016-11-17 MED ORDER — LISINOPRIL 10 MG PO TABS: 10.0000 mg | ORAL_TABLET | Freq: Every day | ORAL | 6 refills | Status: DC

## 2016-11-17 MED ORDER — NITROGLYCERIN 0.4 MG SL SUBL: 0.4000 mg | SUBLINGUAL_TABLET | Freq: Once | SUBLINGUAL | Status: AC

## 2016-11-17 MED ORDER — ASPIRIN 81 MG PO CHEW: 81.0000 mg | CHEWABLE_TABLET | Freq: Every day | ORAL | 3 refills | Status: AC

## 2016-11-17 MED ORDER — SODIUM CHLORIDE 0.9 % IV SOLN: 250.0000 mL | INTRAVENOUS | Status: DC | PRN

## 2016-11-17 MED ORDER — SODIUM CHLORIDE 0.9% FLUSH
3.0000 mL | Freq: Two times a day (BID) | INTRAVENOUS | Status: DC
  Administered 2016-11-17: 11:00:00 3 mL via INTRAVENOUS

## 2016-11-17 MED ORDER — METOPROLOL TARTRATE 25 MG PO TABS
50.0000 mg | ORAL_TABLET | Freq: Two times a day (BID) | ORAL | Status: DC
  Administered 2016-11-17 – 2016-11-20 (×7): 50 mg via ORAL
  Filled 2016-11-17 (×7): qty 2

## 2016-11-17 MED ORDER — TICAGRELOR 90 MG PO TABS: 90.0000 mg | ORAL_TABLET | Freq: Two times a day (BID) | ORAL | 3 refills | Status: DC

## 2016-11-17 MED ORDER — SODIUM CHLORIDE 0.9% FLUSH: 3.0000 mL | INTRAVENOUS | Status: DC | PRN

## 2016-11-17 MED ORDER — NITROGLYCERIN 0.4 MG SL SUBL
SUBLINGUAL_TABLET | SUBLINGUAL | Status: AC
  Administered 2016-11-17: 18:00:00 0.4 mg
  Filled 2016-11-17: qty 1

## 2016-11-17 MED ORDER — HEPARIN BOLUS VIA INFUSION
4000.0000 [IU] | Freq: Once | INTRAVENOUS | Status: AC
  Administered 2016-11-17: 11:00:00 4000 [IU] via INTRAVENOUS
  Filled 2016-11-17: qty 4000

## 2016-11-17 MED ORDER — NITROGLYCERIN IN D5W 200-5 MCG/ML-% IV SOLN: 0.0000 ug/min | INTRAVENOUS | Status: DC | PRN

## 2016-11-17 MED FILL — Fentanyl Citrate Preservative Free (PF) Inj 100 MCG/2ML: INTRAMUSCULAR | Qty: 2 | Status: AC

## 2016-11-17 NOTE — Progress Notes (Signed)
Plan for PCI tomorrow morning 10/31 at 8:30 AM.

## 2016-11-17 NOTE — Progress Notes (Signed)
ANTICOAGULATION CONSULT NOTE  Pharmacy Consult for Heparin  Indication: chest pain/ACS  Allergies  Allergen Reactions  . Crestor [Rosuvastatin]     "BODY ACHES"    Patient Measurements: Height: 5\' 3"  (160 cm) Weight: 198 lb 13.7 oz (90.2 kg) IBW/kg (Calculated) : 52.4  Vital Signs: Temp: 98.6 F (37 C) (10/30 1500) Temp Source: Oral (10/30 1500) BP: 122/79 (10/30 1500) Pulse Rate: 76 (10/30 1500)  Labs:  Recent Labs  11/15/16 0646  11/15/16 2120 11/16/16 0229 11/16/16 0519 11/16/16 0744 11/17/16 0516 11/17/16 0744 11/17/16 1823  HGB 13.9  --   --  14.5  --   --  13.9  --   --   HCT 41.3  --   --  43.1  --   --  41.7  --   --   PLT 288  --   --  315  --   --  277  --   --   LABPROT  --   --  13.9  --   --   --   --   --   --   INR  --   --  1.08  --   --   --   --   --   --   HEPARINUNFRC  --   < >  --  0.23* 0.26*  --   --   --  0.32  CREATININE 0.79  --   --   --   --  0.84 0.84  --   --   TROPONINI  --   < > 1.02* 0.80*  --  0.40*  --  5.84*  --   < > = values in this interval not displayed.  Estimated Creatinine Clearance: 92 mL/min (by C-G formula based on SCr of 0.84 mg/dL).   Assessment: [age] yof admitted with chest pain, with cath showing 90% stenosis at LAD/diag bifurcation s/p stent placement. Patient continues to have chest pain so heparin infusion restarted. PCI rescheduled for tomorrow morning.   Heparin level is therapeutic on low end of normal (0.32) on 1150 units/hr. No s/sx of bleeding.  Goal of Therapy:  Heparin level 0.3-0.7 units/ml Monitor platelets by anticoagulation protocol: Yes   Plan:  Increase heparin infusion to 1200 units/hr Heparin level in am Follow up plans following cath   Loura BackJennifer Daniels, PharmD, BCPS Clinical Pharmacist Phone for today [phone] Main pharmacy - [phone] 11/17/2016 7:20 PM

## 2016-11-17 NOTE — Progress Notes (Signed)
CRITICAL VALUE ALERT  Critical value received:  Troponin  5.84  Date of notification:  11/17/16  Time of notification:  0956  Critical value read back:Yes.    Nurse who received alert:  N.Lenard LanceAbordo  RN  MD notified (1st page):  Dr Rosemary HolmsPatwardhan  Time of first page:  0957  MD notified (2nd page):  Time of second page:  Responding MD:  Dr Rosemary HolmsPatwardhan  Time MD responded:  [phone]

## 2016-11-17 NOTE — Progress Notes (Signed)
CARDIAC REHAB PHASE I   PRE:  Rate/Rhythm: 74 SR    BP: sitting 136/83    SaO2:   MODE:  Ambulation: 1000 ft   POST:  Rate/Rhythm: 95 SR    BP: sitting 151/99, 134/91     SaO2:   Tolerated well, no c/o while walking. Pt did have slight chest discomfort while sitting on edge of bed during education. Encouraged her to walk around a little and communicate with RN if no relief. Ed completed with pt and mother. Good reception. Understands importance of Brilinta and other meds, diet, ex, NTG, and CRPII. Very eager to do CRPII and I will send referral to [place].  [id number]   Kristine Newman CES, ACSM 11/17/2016 9:38 AM

## 2016-11-17 NOTE — Progress Notes (Signed)
Recurrent chest pain. Plan to perform FFR guided PCI to RCA and attempt PCI to diagonal later today. Keep NPO. Start heparin.  Elder NegusManish J Morgen Linebaugh, MD [facility] Pager: [phone] Office: [phone] If no answer Cell [phone]

## 2016-11-17 NOTE — Progress Notes (Signed)
ANTICOAGULATION CONSULT NOTE - Initial Consult  Pharmacy Consult for Heparin  Indication: chest pain/ACS  Allergies  Allergen Reactions  . Crestor [Rosuvastatin]     "BODY ACHES"    Patient Measurements: Height: 5\' 3"  (160 cm) Weight: 198 lb 13.7 oz (90.2 kg) IBW/kg (Calculated) : 52.4  Vital Signs: Temp: 98.3 F (36.8 C) (10/30 0700) Temp Source: Oral (10/30 0700) BP: 130/88 (10/30 0700) Pulse Rate: 60 (10/30 0700)  Labs:  Recent Labs  11/15/16 0646 11/15/16 1442  11/15/16 2120 11/16/16 0229 11/16/16 0519 11/16/16 0744 11/17/16 0516 11/17/16 0744  HGB 13.9  --   --   --  14.5  --   --  13.9  --   HCT 41.3  --   --   --  43.1  --   --  41.7  --   PLT 288  --   --   --  315  --   --  277  --   LABPROT  --   --   --  13.9  --   --   --   --   --   INR  --   --   --  1.08  --   --   --   --   --   HEPARINUNFRC  --  0.21*  --   --  0.23* 0.26*  --   --   --   CREATININE 0.79  --   --   --   --   --  0.84 0.84  --   TROPONINI  --   --   < > 1.02* 0.80*  --  0.40*  --  5.84*  < > = values in this interval not displayed.  Estimated Creatinine Clearance: 92 mL/min (by C-G formula based on SCr of 0.84 mg/dL).   Assessment: [age] yof admitted with chest pain, with cath showing 90% stenosis at LAD/diag bifurcation s/p stent placement. Patient continues to have chest pain so plan to restart heparin infusion and perform FFR guided PCI to RCA and diagonal.   Last level on 1000 units/hr was 0.26, below goal range, prior to being stopped post-cath yesterday. CBC remains stable. No s/sx of bleeding.  Goal of Therapy:  Heparin level 0.3-0.7 units/ml Monitor platelets by anticoagulation protocol: Yes   Plan:  Heparin 4000 units bolus Start heparin infusion at 1150 units/hr Plan for level 6 hours after start, unless plan for cath prior to timing Follow up plans following cath  Girard CooterKimberly Perkins, PharmD Clinical Pharmacist  Pager: [phone] Clinical Phone for 11/17/2016 until  3:30pm: [phone] If after 3:30pm, please call main pharmacy at [phone]

## 2016-11-17 NOTE — Progress Notes (Signed)
Subjective:  Episode of chest pain last night, and after walking with cardiac rehab  One episode of 4 beat NSVT Trop elevated at 5.8 ng/mL  Objective:  Vital Signs in the last 24 hours: Temp:  [97.9 F (36.6 C)-98.5 F (36.9 C)] 98.3 F (36.8 C) (10/30 1110) Pulse Rate:  [60-84] 67 (10/30 1110) Resp:  [15-22] 20 (10/30 1110) BP: (117-142)/(53-90) 117/53 (10/30 1110) SpO2:  [100 %] 100 % (10/30 1110) Weight:  [90.2 kg (198 lb 13.7 oz)] 90.2 kg (198 lb 13.7 oz) (10/30 0443)  Intake/Output from previous day: 10/29 0701 - 10/30 0700 In: 741.7 [P.O.:590; I.V.:151.7] Out: 2950 [Urine:2950] Intake/Output from this shift: No intake/output data recorded.  Physical Exam: Constitutional:  Moderately built and mildly obese in no acute distress.  HENT:  Head: Normocephalic and atraumatic.  Eyes: Conjunctivae are normal.  Neck: Normal range of motion. Neck supple.  Cardiovascular: Regular rhythm.  Exam reveals no gallop and no friction rub.   No murmur heard. Rt radial cath site with no bleeding, hematoma. Respiratory: Effort normal and breath sounds normal.  GI: Soft. Bowel sounds are normal.  Musculoskeletal: Normal range of motion.  Neurological: She is alert.  Skin: Skin is warm and dry.   Lab Results:  Recent Labs  11/16/16 0229 11/17/16 0516  WBC 10.1 9.1  HGB 14.5 13.9  PLT 315 277    Recent Labs  11/16/16 0744 11/17/16 0516  NA 138 137  K 3.6 3.5  CL 108 106  CO2 21* 20*  GLUCOSE 95 89  BUN 8 8  CREATININE 0.84 0.84    Recent Labs  11/16/16 0744 11/17/16 0744  TROPONINI 0.40* 5.84*   Hepatic Function Panel No results for input(s): PROT, ALBUMIN, AST, ALT, ALKPHOS, BILITOT, BILIDIR, IBILI in the last 72 hours.  Recent Labs  11/16/16 0229  CHOL 275*   Procedures   CORONARY BALLOON ANGIOPLASTY  CORONARY STENT INTERVENTION  LEFT HEART CATH AND CORONARY ANGIOGRAPHY  Conclusion   Severe LAD/Diag bifurcation 90% stenosis Moderate prox.mid RCA  stenosis Successful percutaneous coronary intervention         PTCA and stent placement 3.0 X 30 mm drug-eluting stent LAD        PTCA Diag      Cardiac Studies:  Echocardiogram 11/16/2016 Study Conclusions  - Left ventricle: The cavity size was normal. Systolic function was   normal. The estimated ejection fraction was in the range of 55%   to 60%. Mild hypokinesis of the basalinferolateral myocardium.   Left ventricular diastolic function parameters were normal. - No significant valvular abnormality   Assessment: NSTEMI Hyperlipidemia Hypertension Family history of premature CAD Prediabetes  Plan:  Successful LAD PCI, Diag PTCA Recurrent chest pain overnight could be from residual RCA and diag disease. Plan to perform FFR guided PCI to RCA and attempt PCI to diagonal later today. Keep NPO. Start heparin. Continue ASA/ticagrelor/lipitor. Increased metoprolol to 50 mg bid. Switched amlodipine 5 mg to lisinopril 10 mg. Will start metformin outpatient. Could consider Repatha if lipid panel does not improve. See by cardiac rehab.    LOS: 2 days    Naif Alabi J Nancee Brownrigg 11/17/2016, 12:43 PM   Veda Arrellano Emiliano DyerJ Christan Defranco, MD [facility] Pager: [phone] Office: [phone] If no answer Cell [phone]

## 2016-11-17 NOTE — Progress Notes (Signed)
Patient called to be seen , stated left ear is bleeding. small amount of dry blood on left ear lobe noted, assesed  and cleansed  site , noted  small scratch mark inside ear lobe . Patient stated " I scratched my  ear earlier with my  fingernail, it was itchy" No futher bleeding observed. .Educated patient on bleeding precaution  while on blood thinner.. Monitored patient .

## 2016-11-18 ENCOUNTER — Encounter (HOSPITAL_COMMUNITY): Payer: Self-pay | Admitting: Cardiology

## 2016-11-18 ENCOUNTER — Encounter (HOSPITAL_COMMUNITY)
Admission: EM | Disposition: A | Discharge status: Home / Self Care | Payer: Self-pay | Source: Home / Self Care | Attending: Cardiology

## 2016-11-18 ENCOUNTER — Other Ambulatory Visit: Payer: Self-pay

## 2016-11-18 HISTORY — PX: CORONARY STENT INTERVENTION: CATH118234

## 2016-11-18 LAB — CBC
HEMATOCRIT: 42.1 % (ref 36.0–46.0)
Hemoglobin: 14 g/dL (ref 12.0–15.0)
MCH: 30.2 pg (ref 26.0–34.0)
MCHC: 33.3 g/dL (ref 30.0–36.0)
MCV: 90.7 fL (ref 78.0–100.0)
Platelets: 263 10*3/uL (ref 150–400)
RBC: 4.64 MIL/uL (ref 3.87–5.11)
RDW: 13.5 % (ref 11.5–15.5)
WBC: 10.4 10*3/uL (ref 4.0–10.5)

## 2016-11-18 LAB — GLUCOSE, CAPILLARY
Glucose-Capillary: 98 mg/dL (ref 65–99)
Glucose-Capillary: 103 mg/dL — ABNORMAL HIGH (ref 65–99)
Glucose-Capillary: 73 mg/dL (ref 65–99)

## 2016-11-18 LAB — BASIC METABOLIC PANEL
ANION GAP: 8 (ref 5–15)
BUN: 8 mg/dL (ref 6–20)
CO2: 23 mmol/L (ref 22–32)
Calcium: 8.7 mg/dL — ABNORMAL LOW (ref 8.9–10.3)
Chloride: 106 mmol/L (ref 101–111)
Creatinine, Ser: 1.09 mg/dL — ABNORMAL HIGH (ref 0.44–1.00)
GFR calc non Af Amer: >60 mL/min (ref >60)
Glucose, Bld: 74 mg/dL (ref 65–99)
Potassium: 3.8 mmol/L (ref 3.5–5.1)
Sodium: 137 mmol/L (ref 135–145)

## 2016-11-18 LAB — POCT ACTIVATED CLOTTING TIME
ACTIVATED CLOTTING TIME: 714 s
ACTIVATED CLOTTING TIME: 301 s
Activated Clotting Time: 318 seconds
Activated Clotting Time: 351 seconds
Activated Clotting Time: 285 seconds

## 2016-11-18 SURGERY — CORONARY STENT INTERVENTION
Anesthesia: LOCAL

## 2016-11-18 MED ORDER — SODIUM CHLORIDE 0.9% FLUSH
3.0000 mL | Freq: Two times a day (BID) | INTRAVENOUS | Status: DC
  Administered 2016-11-19: 10:00:00 3 mL via INTRAVENOUS

## 2016-11-18 MED ORDER — HEPARIN (PORCINE) IN NACL 2-0.9 UNIT/ML-% IJ SOLN
INTRAMUSCULAR | Status: AC | PRN
  Administered 2016-11-18: 1000 mL via INTRA_ARTERIAL

## 2016-11-18 MED ORDER — ACETAMINOPHEN 325 MG PO TABS
650.0000 mg | ORAL_TABLET | ORAL | Status: DC | PRN
  Administered 2016-11-19 – 2016-11-20 (×4): 650 mg via ORAL
  Filled 2016-11-18 (×4): qty 2

## 2016-11-18 MED ORDER — HEPARIN SODIUM (PORCINE) 1000 UNIT/ML IJ SOLN
INTRAMUSCULAR | Status: AC
  Filled 2016-11-18: qty 1

## 2016-11-18 MED ORDER — NITROGLYCERIN 0.4 MG SL SUBL
0.4000 mg | SUBLINGUAL_TABLET | SUBLINGUAL | Status: DC | PRN
  Administered 2016-11-18 – 2016-11-19 (×2): 0.4 mg via SUBLINGUAL
  Filled 2016-11-18: qty 1

## 2016-11-18 MED ORDER — DIPHENHYDRAMINE HCL 50 MG/ML IJ SOLN
INTRAMUSCULAR | Status: AC
  Filled 2016-11-18: qty 1

## 2016-11-18 MED ORDER — FENTANYL CITRATE (PF) 100 MCG/2ML IJ SOLN
INTRAMUSCULAR | Status: DC | PRN
  Administered 2016-11-18 (×2): 50 ug via INTRAVENOUS

## 2016-11-18 MED ORDER — IOPAMIDOL (ISOVUE-370) INJECTION 76%
INTRAVENOUS | Status: AC
  Filled 2016-11-18: qty 100

## 2016-11-18 MED ORDER — TICAGRELOR 90 MG PO TABS
ORAL_TABLET | ORAL | Status: AC
  Filled 2016-11-18: qty 1

## 2016-11-18 MED ORDER — MIDAZOLAM HCL 2 MG/2ML IJ SOLN
INTRAMUSCULAR | Status: AC
  Filled 2016-11-18: qty 2

## 2016-11-18 MED ORDER — VERAPAMIL HCL 2.5 MG/ML IV SOLN
INTRAVENOUS | Status: AC
  Filled 2016-11-18: qty 2

## 2016-11-18 MED ORDER — ADENOSINE 12 MG/4ML IV SOLN
INTRAVENOUS | Status: AC
  Filled 2016-11-18: qty 16

## 2016-11-18 MED ORDER — LIDOCAINE HCL 2 % IJ SOLN
INTRAMUSCULAR | Status: AC
  Filled 2016-11-18: qty 20

## 2016-11-18 MED ORDER — ONDANSETRON HCL 4 MG/2ML IJ SOLN: 4.0000 mg | Freq: Four times a day (QID) | INTRAMUSCULAR | Status: DC | PRN

## 2016-11-18 MED ORDER — ALUM & MAG HYDROXIDE-SIMETH 200-200-20 MG/5ML PO SUSP
30.0000 mL | Freq: Once | ORAL | Status: AC
  Administered 2016-11-18: 30 mL via ORAL
  Filled 2016-11-18: qty 30

## 2016-11-18 MED ORDER — NITROGLYCERIN 1 MG/10 ML FOR IR/CATH LAB
INTRA_ARTERIAL | Status: AC
  Filled 2016-11-18: qty 10

## 2016-11-18 MED ORDER — SODIUM CHLORIDE 0.9% FLUSH: 3.0000 mL | INTRAVENOUS | Status: DC | PRN

## 2016-11-18 MED ORDER — TICAGRELOR 90 MG PO TABS
ORAL_TABLET | ORAL | Status: DC | PRN
  Administered 2016-11-18: 90 mg via ORAL

## 2016-11-18 MED ORDER — IOPAMIDOL (ISOVUE-370) INJECTION 76%
INTRAVENOUS | Status: DC | PRN
  Administered 2016-11-18: 220 mL via INTRA_ARTERIAL

## 2016-11-18 MED ORDER — FENTANYL CITRATE (PF) 100 MCG/2ML IJ SOLN
INTRAMUSCULAR | Status: AC
  Filled 2016-11-18: qty 2

## 2016-11-18 MED ORDER — SODIUM CHLORIDE 0.9 % IV SOLN: 250.0000 mL | INTRAVENOUS | Status: DC | PRN

## 2016-11-18 MED ORDER — HYDRALAZINE HCL 20 MG/ML IJ SOLN: 5.0000 mg | INTRAMUSCULAR | Status: AC | PRN

## 2016-11-18 MED ORDER — DIPHENHYDRAMINE HCL 50 MG/ML IJ SOLN
INTRAMUSCULAR | Status: DC | PRN
  Administered 2016-11-18: 25 mg via INTRAVENOUS

## 2016-11-18 MED ORDER — HEPARIN (PORCINE) IN NACL 2-0.9 UNIT/ML-% IJ SOLN
INTRAMUSCULAR | Status: AC
  Filled 2016-11-18: qty 1000

## 2016-11-18 MED ORDER — HEPARIN SODIUM (PORCINE) 1000 UNIT/ML IJ SOLN
INTRAMUSCULAR | Status: DC | PRN
  Administered 2016-11-18: 3000 [IU] via INTRAVENOUS
  Administered 2016-11-18: 2000 [IU] via INTRAVENOUS
  Administered 2016-11-18: 6000 [IU] via INTRAVENOUS
  Administered 2016-11-18: 1000 [IU] via INTRAVENOUS

## 2016-11-18 MED ORDER — MIDAZOLAM HCL 2 MG/2ML IJ SOLN
INTRAMUSCULAR | Status: DC | PRN
  Administered 2016-11-18: 2 mg via INTRAVENOUS

## 2016-11-18 MED ORDER — LIDOCAINE HCL 2 % IJ SOLN
INTRAMUSCULAR | Status: DC | PRN
  Administered 2016-11-18: 2 mL via INTRADERMAL

## 2016-11-18 MED ORDER — ADENOSINE (DIAGNOSTIC) 140MCG/KG/MIN
INTRAVENOUS | Status: DC | PRN
  Administered 2016-11-18: 140 ug/kg/min via INTRAVENOUS

## 2016-11-18 MED ORDER — NITROGLYCERIN 1 MG/10 ML FOR IR/CATH LAB
INTRA_ARTERIAL | Status: DC | PRN
  Administered 2016-11-18: 200 ug via INTRACORONARY

## 2016-11-18 MED ORDER — SODIUM CHLORIDE 0.9 % IV SOLN
INTRAVENOUS | Status: AC
  Administered 2016-11-18: 15:00:00 via INTRAVENOUS

## 2016-11-18 MED ORDER — IOPAMIDOL (ISOVUE-370) INJECTION 76%
INTRAVENOUS | Status: AC
  Filled 2016-11-18: qty 50

## 2016-11-18 MED ORDER — VERAPAMIL HCL 2.5 MG/ML IV SOLN
INTRAVENOUS | Status: DC | PRN
  Administered 2016-11-18: 10 mL via INTRA_ARTERIAL

## 2016-11-18 MED ORDER — LABETALOL HCL 5 MG/ML IV SOLN: 10.0000 mg | INTRAVENOUS | Status: AC | PRN

## 2016-11-18 SURGICAL SUPPLY — 26 items
BALLN SAPPHIRE 2.5X15 (BALLOONS) ×2
BALLN SAPPHIRE ~~LOC~~ 3.0X10 (BALLOONS) ×2 IMPLANT
BALLN SAPPHIRE ~~LOC~~ 3.5X18 (BALLOONS) ×2 IMPLANT
BALLN ~~LOC~~ EMERGE MR 2.5X8 (BALLOONS) ×2
BALLOON SAPPHIRE 2.5X15 (BALLOONS) ×1 IMPLANT
BALLOON ~~LOC~~ EMERGE MR 2.5X8 (BALLOONS) ×1 IMPLANT
CATH LAUNCHER 5F JL3 (CATHETERS) ×1 IMPLANT
CATH LAUNCHER 6FR EBU 3 (CATHETERS) ×2 IMPLANT
CATH LAUNCHER 6FR EBU3.5 (CATHETERS) ×2 IMPLANT
CATH LAUNCHER 6FR JR4 (CATHETERS) ×2 IMPLANT
CATHETER LAUNCHER 5F JL3 (CATHETERS) ×2
DEVICE RAD COMP TR BAND LRG (VASCULAR PRODUCTS) ×2 IMPLANT
GLIDESHEATH SLEND A-KIT 6F 22G (SHEATH) ×2 IMPLANT
GUIDEWIRE INQWIRE 1.5J.035X260 (WIRE) ×1 IMPLANT
GUIDEWIRE PRESSURE COMET II (WIRE) ×2 IMPLANT
INQWIRE 1.5J .035X260CM (WIRE) ×2
KIT ENCORE 26 ADVANTAGE (KITS) ×4 IMPLANT
KIT HEART LEFT (KITS) ×2 IMPLANT
PACK CARDIAC CATHETERIZATION (CUSTOM PROCEDURE TRAY) ×2 IMPLANT
STENT SYNERGY DES 2.5X20 (Permanent Stent) ×2 IMPLANT
STENT SYNERGY DES 3X32 (Permanent Stent) ×2 IMPLANT
TRANSDUCER W/STOPCOCK (MISCELLANEOUS) ×2 IMPLANT
TUBING CIL FLEX 10 FLL-RA (TUBING) ×2 IMPLANT
VALVE GUARDIAN II ~~LOC~~ HEMO (MISCELLANEOUS) ×2 IMPLANT
WIRE ASAHI SOFT 180CM (WIRE) ×2 IMPLANT
WIRE COUGAR XT STRL 190CM (WIRE) ×4 IMPLANT

## 2016-11-18 NOTE — Interval H&P Note (Signed)
History and Physical Interval Note:  11/18/2016 8:16 AM  Kristine Newman  has presented today for surgery, with the diagnosis of cp  The various methods of treatment have been discussed with the patient and family. After consideration of risks, benefits and other options for treatment, the patient has consented to  Procedure(s): LEFT HEART CATH AND CORONARY ANGIOGRAPHY (N/A) as a surgical intervention .  The patient's history has been reviewed, patient examined, no change in status, stable for surgery.  I have reviewed the patient's chart and labs.  Questions were answered to the patient's satisfaction.    Cath Lab Visit (complete for each Cath Lab visit)  Clinical Evaluation Leading to the Procedure:   ACS: Yes.    Non-ACS:    Anginal Classification: CCS IV  Anti-ischemic medical therapy: Maximal Therapy (2 or more classes of medications)  Non-Invasive Test Results: No non-invasive testing performed  Prior CABG: No previous CABG          Manish J Patwardhan

## 2016-11-18 NOTE — Progress Notes (Signed)
TR BAND REMOVAL  LOCATION:    right radial  DEFLATED PER PROTOCOL:    Yes.    TIME BAND OFF / DRESSING APPLIED:    1445   SITE UPON ARRIVAL:    Level 0  SITE AFTER BAND REMOVAL:    Level 0  CIRCULATION SENSATION AND MOVEMENT:    Within Normal Limits   Yes.    COMMENTS:   Tolerated procedure well 

## 2016-11-18 NOTE — Progress Notes (Signed)
Notified MD of chest pressure under left breast 1/10.  Orders received.

## 2016-11-18 NOTE — H&P (View-Only) (Signed)
Plan for PCI tomorrow morning 10/31 at 8:30 AM. 

## 2016-11-18 NOTE — Progress Notes (Addendum)
Subjective:  Feels well. Underwent successful Diag and RCA PCI today.  Objective:  Vital Signs in the last 24 hours: Temp:  [97 F (36.1 C)-98.8 F (37.1 C)] 98.7 F (37.1 C) (10/31 1613) Pulse Rate:  [68-94] 70 (10/31 1613) Resp:  [0-23] 14 (10/31 1613) BP: (112-147)/(66-99) 118/86 (10/31 1613) SpO2:  [0 %-100 %] 100 % (10/31 1613) Weight:  [91 kg (200 lb 9.9 oz)] 91 kg (200 lb 9.9 oz) (10/31 0213)  Intake/Output from previous day: 10/30 0701 - 10/31 0700 In: 558.4 [P.O.:240; I.V.:318.4] Out: 700 [Urine:700] Intake/Output from this shift: Total I/O In: 480 [P.O.:480] Out: 1800 [Urine:1800]  Physical Exam: Constitutional:  Moderately built and mildly obese in no acute distress.  HENT:  Head: Normocephalic and atraumatic.  Eyes: Conjunctivae are normal.  Neck: Normal range of motion. Neck supple.  Cardiovascular: Regular rhythm.  Exam reveals no gallop and no friction rub.   No murmur heard. Rt radial cath site with no bleeding, hematoma. Respiratory: Effort normal and breath sounds normal.  GI: Soft. Bowel sounds are normal.  Musculoskeletal: Normal range of motion.  Neurological: She is alert.  Skin: Skin is warm and dry.   Lab Results:  Recent Labs  11/17/16 0516 11/18/16 0300  WBC 9.1 10.4  HGB 13.9 14.0  PLT 277 263    Recent Labs  11/17/16 0516 11/18/16 1523  NA 137 137  K 3.5 3.8  CL 106 106  CO2 20* 23  GLUCOSE 89 74  BUN 8 8  CREATININE 0.84 1.09*    Recent Labs  11/16/16 0744 11/17/16 0744  TROPONINI 0.40* 5.84*   Hepatic Function Panel No results for input(s): PROT, ALBUMIN, AST, ALT, ALKPHOS, BILITOT, BILIDIR, IBILI in the last 72 hours.  Recent Labs  11/16/16 0229  CHOL 275*   Procedures   CORONARY BALLOON ANGIOPLASTY  CORONARY STENT INTERVENTION  LEFT HEART CATH AND CORONARY ANGIOGRAPHY  Conclusion   Severe LAD/Diag bifurcation 90% stenosis Moderate prox.mid RCA stenosis Successful percutaneous coronary intervention          PTCA and stent placement 3.0 X 30 mm drug-eluting stent LAD        PTCA Diag      Cardiac Studies:  Echocardiogram 11/16/2016 Study Conclusions  - Left ventricle: The cavity size was normal. Systolic function was   normal. The estimated ejection fraction was in the range of 55%   to 60%. Mild hypokinesis of the basalinferolateral myocardium.   Left ventricular diastolic function parameters were normal. - No significant valvular abnormality   Assessment: NSTEMI Hyperlipidemia Hypertension Family history of premature CAD Prediabetes Mild increase in Cr, not AKI  Plan:  Successful LAD PCI 11/16/2016, PCI to Diag and RCA 11/18/2016. Continue ASA/ticagrelor/lipitor. Continue metoprolol to 50 mg bid, lisinopril 10 mg. Continue IV hydration today. Will start metformin outpatient. Could consider Repatha if lipid panel does not improve. See by cardiac rehab.  Possible discharge 11/1 or 11/2   LOS: 3 days    Hollis Tuller J Crist Kruszka 11/18/2016, 5:17 PM   Kanae Ignatowski Emiliano DyerJ Meris Reede, MD [facility] Pager: [phone] Office: [phone] If no answer Cell [phone]

## 2016-11-19 LAB — BASIC METABOLIC PANEL
Anion gap: 8 (ref 5–15)
BUN: 9 mg/dL (ref 6–20)
CALCIUM: 8.7 mg/dL — AB (ref 8.9–10.3)
CO2: 22 mmol/L (ref 22–32)
CREATININE: 0.9 mg/dL (ref 0.44–1.00)
Chloride: 107 mmol/L (ref 101–111)
GFR calc Af Amer: >60 mL/min (ref >60)
GLUCOSE: 92 mg/dL (ref 65–99)
Potassium: 3.7 mmol/L (ref 3.5–5.1)
SODIUM: 137 mmol/L (ref 135–145)

## 2016-11-19 LAB — TROPONIN I: Troponin I: 2.74 ng/mL (ref <0.03)

## 2016-11-19 LAB — GLUCOSE, CAPILLARY
GLUCOSE-CAPILLARY: 96 mg/dL (ref 65–99)
GLUCOSE-CAPILLARY: 82 mg/dL (ref 65–99)
GLUCOSE-CAPILLARY: 78 mg/dL (ref 65–99)
Glucose-Capillary: 76 mg/dL (ref 65–99)

## 2016-11-19 LAB — CBC
HCT: 38.7 % (ref 36.0–46.0)
Hemoglobin: 13 g/dL (ref 12.0–15.0)
MCH: 30.4 pg (ref 26.0–34.0)
MCHC: 33.6 g/dL (ref 30.0–36.0)
MCV: 90.6 fL (ref 78.0–100.0)
PLATELETS: 240 10*3/uL (ref 150–400)
RBC: 4.27 MIL/uL (ref 3.87–5.11)
RDW: 13.5 % (ref 11.5–15.5)
WBC: 7.6 10*3/uL (ref 4.0–10.5)

## 2016-11-19 MED ORDER — LISINOPRIL 10 MG PO TABS: 20.0000 mg | ORAL_TABLET | Freq: Every day | ORAL | Status: DC

## 2016-11-19 MED ORDER — LISINOPRIL 10 MG PO TABS
10.0000 mg | ORAL_TABLET | Freq: Every day | ORAL | Status: DC
Start: 2016-11-19 — End: 2016-11-20
  Administered 2016-11-19 – 2016-11-20 (×2): 10 mg via ORAL
  Filled 2016-11-19 (×2): qty 1

## 2016-11-19 NOTE — Progress Notes (Addendum)
[facility] will continue to monitor.  Patient asymptomatic

## 2016-11-19 NOTE — Progress Notes (Signed)
Subjective:  Feels well. One episode of resting pain under left breast. Normal EKG.  6 beat NSVT.   Objective:  Vital Signs in the last 24 hours: Temp:  [97 F (36.1 C)-98.8 F (37.1 C)] 98.4 F (36.9 C) (11/01 0700) Pulse Rate:  [68-94] 76 (11/01 0700) Resp:  [6-18] 18 (11/01 0434) BP: (101-147)/(53-99) 118/66 (11/01 0700) SpO2:  [0 %-100 %] 100 % (11/01 0700)  Intake/Output from previous day: 10/31 0701 - 11/01 0700 In: 720 [P.O.:720] Out: 2600 [Urine:2600] Intake/Output from this shift: No intake/output data recorded.  Physical Exam: Constitutional:  Moderately built and mildly obese in no acute distress.  HENT:  Head: Normocephalic and atraumatic.  Eyes: Conjunctivae are normal.  Neck: Normal range of motion. Neck supple.  Cardiovascular: Regular rhythm.  Exam reveals no gallop and no friction rub.   No murmur heard. Rt radial cath site with no bleeding, hematoma. Respiratory: Effort normal and breath sounds normal.  GI: Soft. Bowel sounds are normal.  Musculoskeletal: Normal range of motion.  Neurological: She is alert.  Skin: Skin is warm and dry.   Lab Results:  Recent Labs  11/18/16 0300 11/19/16 0434  WBC 10.4 7.6  HGB 14.0 13.0  PLT 263 240    Recent Labs  11/18/16 1523 11/19/16 0434  NA 137 137  K 3.8 3.7  CL 106 107  CO2 23 22  GLUCOSE 74 92  BUN 8 9  CREATININE 1.09* 0.90    Recent Labs  11/17/16 0744  TROPONINI 5.84*   Hepatic Function Panel No results for input(s): PROT, ALBUMIN, AST, ALT, ALKPHOS, BILITOT, BILIDIR, IBILI in the last 72 hours. No results for input(s): CHOL in the last 72 hours. Procedures   CORONARY BALLOON ANGIOPLASTY  CORONARY STENT INTERVENTION  LEFT HEART CATH AND CORONARY ANGIOGRAPHY  Conclusion   Severe LAD/Diag bifurcation 90% stenosis Moderate prox.mid RCA stenosis Successful percutaneous coronary intervention         PTCA and stent placement 3.0 X 30 mm drug-eluting stent LAD        PTCA Diag       Cardiac Studies:  Echocardiogram 11/16/2016 Study Conclusions  - Left ventricle: The cavity size was normal. Systolic function was   normal. The estimated ejection fraction was in the range of 55%   to 60%. Mild hypokinesis of the basalinferolateral myocardium.   Left ventricular diastolic function parameters were normal. - No significant valvular abnormality   Assessment: NSTEMI Hyperlipidemia Hypertension Family history of premature CAD Prediabetes Mild increase in Cr, now improved.  Chest pain overnight without any EKG changes. Trop pending. Pain could be due to small septals jailed. EKG has resolved back to normal rhythm conduction.   Plan:  Successful LAD PCI 11/16/2016, PCI to Diag and RCA 11/18/2016. Continue ASA/ticagrelor/lipitor. Continue metoprolol to 50 mg bid, lisinopril 10 mg. Will start metformin outpatient. Could consider Repatha if lipid panel does not improve. See by cardiac rehab.  Possible discharge 11/1 or 11/2   LOS: 4 days    Manish J Patwardhan 11/19/2016, 9:39 AM   Elder NegusManish J Patwardhan, MD [facility] Pager: [phone] Office: [phone] If no answer Cell [phone]

## 2016-11-19 NOTE — Progress Notes (Signed)
CARDIAC REHAB PHASE I   PRE:  Rate/Rhythm: 69 SR    BP: sitting 123/78    SaO2:   MODE:  Ambulation: 1000 ft   POST:  Rate/Rhythm: 82 SR    BP: sitting 154/95     SaO2:    Pt emotional and nervous. C/o left chest pressure while sitting. This resolved with walking. Pt began to c/o slight sensation in the center of her chest with walking. She is very anxious/tearful and BP elevated after walk. Reassured pt, encouraged more walking today. Reviewed meds, diet, ex, and CRPII. Pt is very eager to start CRPII.  [id number]  Harriet MassonRandi Kristan Adelayde Minney CES, ACSM 11/19/2016 9:57 AM

## 2016-11-19 NOTE — Progress Notes (Signed)
Notified Troponin I critical level 2074 at 1003. Notified Seymour BarsNeri Abordo RN, patients nurse and she will follow up

## 2016-11-20 ENCOUNTER — Encounter (HOSPITAL_COMMUNITY): Payer: Self-pay | Admitting: Cardiology

## 2016-11-20 LAB — CBC
HEMATOCRIT: 38.1 % (ref 36.0–46.0)
HEMOGLOBIN: 12.5 g/dL (ref 12.0–15.0)
MCH: 29.8 pg (ref 26.0–34.0)
MCHC: 32.8 g/dL (ref 30.0–36.0)
MCV: 90.7 fL (ref 78.0–100.0)
Platelets: 250 10*3/uL (ref 150–400)
RBC: 4.2 MIL/uL (ref 3.87–5.11)
RDW: 13.7 % (ref 11.5–15.5)
WBC: 8 10*3/uL (ref 4.0–10.5)

## 2016-11-20 LAB — GLUCOSE, CAPILLARY: Glucose-Capillary: 85 mg/dL (ref 65–99)

## 2016-11-20 MED ORDER — LISINOPRIL 10 MG PO TABS: 10.0000 mg | ORAL_TABLET | Freq: Every day | ORAL | 3 refills | Status: DC

## 2016-11-20 MED ORDER — ASPIRIN 81 MG PO CHEW: 81.0000 mg | CHEWABLE_TABLET | Freq: Every day | ORAL | 3 refills | Status: DC

## 2016-11-20 MED ORDER — NITROGLYCERIN 0.4 MG SL SUBL: 0.4000 mg | SUBLINGUAL_TABLET | SUBLINGUAL | 3 refills | Status: DC | PRN

## 2016-11-20 MED ORDER — ATORVASTATIN CALCIUM 80 MG PO TABS: 80.0000 mg | ORAL_TABLET | Freq: Every day | ORAL | 3 refills | Status: DC

## 2016-11-20 NOTE — Progress Notes (Addendum)
CARDIAC REHAB PHASE I   PRE:  Rate/Rhythm: 76 SR   BP:  Sitting: 124/74      SaO2:   MODE:  Ambulation: 700 ft   POST:  Rate/Rhythm: 82 SR  BP:  Sitting: 127/85      SaO2:   Pt ambulated 700 ft with steady gait. Pt had slight chest discomfort and lightheadedness at begining of walk. It resolved with walking. Pt stopped twice to catch breath. Pt stated she was "weak from being in the bed." Pt was very nervous about going home. Provided pt with emotional support. Reviewed restrictions and exercise guidelines and CRPII. Pt is very excited about CRPII and making lifestyle modifications. Will send referral to [place] CRPII. Pt returned to recliner with call bell within reach.  [phone]   [name] MS, ACSM CEP  8:44 AM 11/20/2016

## 2016-11-20 NOTE — Discharge Summary (Addendum)
Physician Discharge Summary  Patient ID: Kristine Newman MRN: [id number] DOB/AGE: [birth year] 41 y.o.  Admit date: 11/15/2016 Discharge date: 11/20/2016  Admission Diagnoses:  Discharge Diagnoses:  NSTEMI s/p successful PCI Hypertension Type 2 DM Obesity  Discharged Condition: good  Hospital Course:  41 year old African American female with hypertension, hyperlipidemia, prediabetes, family h/o premature CAD was admitted to the hospital on 11/15/2016 with chest pain and NSTEMI. She underwent PCI to 90% mLAD stenosis on 11/16/2016 with 3.0 X 30 mm drug-eluting stent. Patient had ostial diagonal branch for which PTCA was performed, but led to small dissection. I was unable to wire the true lumen, but patient had TIMI 3 flow. She also had residual pRCA disease. I decided to stage the procedure given 275 cc contrast use. However, patient had overnight chest pain on 10/29. We, thus, brought her back today to stent diagonal and perform FFR guided RCA PCI, as necessary. She underwent Successful complex bifurcation stenting to Diag 2.5 X 20 mm Synergy drug-eluting stent and Successful percutaneous coronary intervention pRCA PTCA and stent placement 3.0 X 32 mm Synergy drug-eluting stent.  She had brieff NSVT 6 beats, which improved with uptitrating metoprolol. She had discomfort under left breast at rest but not with exertion with no EKG changes and no furtehr rise in trop (on 11/19/2016). On the day of discharge, she is doing well and in good spirits. I have given her out of work notice until she sees me for f/u on 11/9. She is referred to cardiac rehab. She is in middle of changing her PCP. She will benefit from being on metformin for her pre-diabetes. I will repeat lipid panel in 3 months. If <50% drop in LDL, will consider Repatha.  Consults: Cardiac rehab   Significant Diagnostic Studies: Study Conclusions  - Left ventricle: The cavity size was normal. Systolic function was   normal. The estimated  ejection fraction was in the range of 55%   to 60%. Mild hypokinesis of the basalinferolateral myocardium.   Left ventricular diastolic function parameters were normal. - No significant valvular abnormality  CLINICAL DATA:  Acute onset of central chest pain and nausea.  EXAM: CHEST  2 VIEW  COMPARISON:  Chest radiograph performed 11/10/2016  FINDINGS: The lungs are well-aerated and clear. There is no evidence of focal opacification, pleural effusion or pneumothorax.  The heart is normal in size; the mediastinal contour is within normal limits. No acute osseous abnormalities are seen.  IMPRESSION: No acute cardiopulmonary process seen.  Results for Kristine Newman, Kristine Newman (MRN [id number]) as of 11/20/2016 09:26  Ref. Range 11/20/2016 02:31  WBC Latest Ref Range: 4.0 - 10.5 K/uL 8.0  RBC Latest Ref Range: 3.87 - 5.11 MIL/uL 4.20  Hemoglobin Latest Ref Range: 12.0 - 15.0 g/dL 78.212.5  HCT Latest Ref Range: 36.0 - 46.0 % 38.1  MCV Latest Ref Range: 78.0 - 100.0 fL 90.7  MCH Latest Ref Range: 26.0 - 34.0 pg 29.8  MCHC Latest Ref Range: 30.0 - 36.0 g/dL 95.632.8  RDW Latest Ref Range: 11.5 - 15.5 % 13.7  Platelets Latest Ref Range: 150 - 400 K/uL 250   Results for Kristine Newman, Kristine Newman (MRN [id number]) as of 11/20/2016 09:26  Ref. Range 11/19/2016 04:34  BASIC METABOLIC PANEL Unknown Rpt (A)  Sodium Latest Ref Range: 135 - 145 mmol/L 137  Potassium Latest Ref Range: 3.5 - 5.1 mmol/L 3.7  Chloride Latest Ref Range: 101 - 111 mmol/L 107  CO2 Latest Ref Range: 22 - 32 mmol/L 22  Glucose Latest Ref Range:  65 - 99 mg/dL 92  BUN Latest Ref Range: 6 - 20 mg/dL 9  Creatinine Latest Ref Range: 0.44 - 1.00 mg/dL 1.61  Calcium Latest Ref Range: 8.9 - 10.3 mg/dL 8.7 (L)  Anion gap Latest Ref Range: 5 - 15  8  GFR, Est Non African American Latest Ref Range: >60 mL/min >60  GFR, Est African American Latest Ref Range: >60 mL/min >60   Results for Kristine Newman, Kristine Newman (MRN [id number]) as of 11/20/2016 09:26  Ref. Range  11/16/2016 02:29 11/16/2016 07:44 11/17/2016 07:44 11/19/2016 08:21  Troponin I Latest Ref Range: <0.03 ng/mL 0.80 (HH) 0.40 (HH) 5.84 (HH) 2.74 (HH)     Treatments: Severe LAD/Diag bifurcation 90% stenosis Moderate prox.mid RCA stenosis Successful percutaneous coronary intervention         PTCA and stent placement 3.0 X 30 mm drug-eluting stent LAD         Successful complex bifurcation stenting to  Diag       2.5 X 20 mm Synergy drug-eluting stent Fractional Flow Reserve RCA (0.78) Successful percutaneous coronary intervention pRCA        PTCA and stent placement 3.0 X 32 mm Synergy drug-eluting stent   Discharge Exam: Blood pressure 126/74, pulse 80, temperature 98.3 F (36.8 C), temperature source Oral, resp. rate 18, height 5\' 3"  (1.6 m), weight 89.7 kg (197 lb 12.8 oz), SpO2 96 %. Constitutional:  Moderately built and mildly obese in no acute distress. HENT:  Head: Normocephalicand atraumatic.  Eyes: Conjunctivaeare normal.  Neck: Normal range of motion. Neck supple.  Cardiovascular: Regular rhythm. Exam reveals no gallopand no friction rub.  No murmurheard. Rt radial cath site with no bleeding, hematoma. Respiratory: Effort normaland breath sounds normal.  GI: Soft. Bowel sounds are normal.  Musculoskeletal: Normal range of motion.  Neurological: She is alert.  Skin: Skin is warmand dry.   Disposition: 01-Home or Self Care  Discharge Instructions    AMB Referral to Cardiac Rehabilitation - Phase II    Complete by:  As directed    Diagnosis:  PTCA   Amb Referral to Cardiac Rehabilitation    Complete by:  As directed    Diagnosis:   Coronary Stents PTCA NSTEMI     Diet - low sodium heart healthy    Complete by:  As directed    Increase activity slowly    Complete by:  As directed      Allergies as of 11/20/2016      Reactions   Crestor [rosuvastatin]    "BODY ACHES"      Medication List    STOP taking these medications   aspirin 325 MG  tablet Replaced by:  aspirin 81 MG chewable tablet     TAKE these medications   amLODipine 2.5 MG tablet Commonly known as:  NORVASC Take 2.5 mg by mouth daily.   aspirin 81 MG chewable tablet Commonly known as:  ASPIRIN CHILDRENS Chew 1 tablet (81 mg total) by mouth daily. Replaces:  aspirin 325 MG tablet   aspirin 81 MG chewable tablet Chew 1 tablet (81 mg total) by mouth daily.   atorvastatin 80 MG tablet Commonly known as:  LIPITOR Take 1 tablet (80 mg total) by mouth daily at 6 PM.   fluticasone 50 MCG/ACT nasal spray Commonly known as:  FLONASE Place 2 sprays into both nostrils daily.   guaiFENesin 600 MG 12 hr tablet Commonly known as:  MUCINEX Take 600 mg by mouth 2 (two) times daily as needed for to loosen  phlegm.   ipratropium 0.06 % nasal spray Commonly known as:  ATROVENT Place 2 sprays into the nose daily as needed for rhinitis.   lisinopril 10 MG tablet Commonly known as:  PRINIVIL,ZESTRIL Take 1 tablet (10 mg total) by mouth daily.   lisinopril 10 MG tablet Commonly known as:  PRINIVIL,ZESTRIL Take 1 tablet (10 mg total) by mouth daily.   metoprolol tartrate 50 MG tablet Commonly known as:  LOPRESSOR Take 1 tablet (50 mg total) by mouth 2 (two) times daily.   nitroGLYCERIN 0.4 MG SL tablet Commonly known as:  NITROSTAT Place 1 tablet (0.4 mg total) under the tongue every 5 (five) minutes as needed for chest pain.   PROAIR HFA 108 (90 Base) MCG/ACT inhaler Generic drug:  albuterol Take 2 puffs by mouth every 4 (four) hours as needed for wheezing or shortness of breath.   ticagrelor 90 MG Tabs tablet Commonly known as:  BRILINTA Take 1 tablet (90 mg total) by mouth 2 (two) times daily.   Vitamin D (Ergocalciferol) 50000 units Caps capsule Commonly known as:  DRISDOL Take 50,000 Units by mouth every Tuesday.      Follow-up Information    Elder Negus, MD Follow up on 11/27/2016.   Specialty:  Cardiology Why:  9:00 AM Contact  information: [address] [place] [address] [place] [address] [phone]           Signed: Elder Negus 11/20/2016, 9:21 AM  Elder Negus, MD [facility] Pager: [phone] Office: [phone] If no answer Cell [phone]

## 2016-11-23 ENCOUNTER — Telehealth (HOSPITAL_COMMUNITY): Payer: Self-pay

## 2016-11-23 NOTE — Telephone Encounter (Signed)
Patients insurance is active and benefits verified through [employer] - No co-pay, deductible amount of $1,250/$1,250 has been met, out of pocket amount of $4,350/$2,193.60 has been met, 20% co-insurance, and no pre-authorization is required. Reference [phone]  Patient will be contacted and scheduled after their follow visit with the cardiologist office upon review by the RN Navigator.

## 2016-11-28 ENCOUNTER — Emergency Department (HOSPITAL_COMMUNITY): Payer: BC Managed Care – PPO

## 2016-11-28 ENCOUNTER — Emergency Department (HOSPITAL_COMMUNITY)
Admission: EM | Admit: 2016-11-28 | Discharge: 2016-11-28 | Disposition: A | Discharge status: Home / Self Care | Payer: BC Managed Care – PPO | Attending: Emergency Medicine | Admitting: Emergency Medicine

## 2016-11-28 ENCOUNTER — Encounter (HOSPITAL_COMMUNITY): Payer: Self-pay | Admitting: *Deleted

## 2016-11-28 DIAGNOSIS — I10 Essential (primary) hypertension: Secondary | ICD-10-CM | POA: Diagnosis not present

## 2016-11-28 DIAGNOSIS — Z79899 Other long term (current) drug therapy: Secondary | ICD-10-CM | POA: Insufficient documentation

## 2016-11-28 DIAGNOSIS — R0602 Shortness of breath: Secondary | ICD-10-CM | POA: Diagnosis not present

## 2016-11-28 DIAGNOSIS — Z955 Presence of coronary angioplasty implant and graft: Secondary | ICD-10-CM | POA: Insufficient documentation

## 2016-11-28 LAB — BASIC METABOLIC PANEL
Anion gap: 12 (ref 5–15)
BUN: 13 mg/dL (ref 6–20)
CALCIUM: 9.5 mg/dL (ref 8.9–10.3)
CO2: 19 mmol/L — ABNORMAL LOW (ref 22–32)
CREATININE: 1.07 mg/dL — AB (ref 0.44–1.00)
Chloride: 105 mmol/L (ref 101–111)
GFR calc non Af Amer: >60 mL/min (ref >60)
Glucose, Bld: 93 mg/dL (ref 65–99)
Potassium: 3.8 mmol/L (ref 3.5–5.1)
SODIUM: 136 mmol/L (ref 135–145)

## 2016-11-28 LAB — CBC
HCT: 41.8 % (ref 36.0–46.0)
Hemoglobin: 13.8 g/dL (ref 12.0–15.0)
MCH: 30 pg (ref 26.0–34.0)
MCHC: 33 g/dL (ref 30.0–36.0)
MCV: 90.9 fL (ref 78.0–100.0)
PLATELETS: 332 10*3/uL (ref 150–400)
RBC: 4.6 MIL/uL (ref 3.87–5.11)
RDW: 13.2 % (ref 11.5–15.5)
WBC: 6.5 10*3/uL (ref 4.0–10.5)

## 2016-11-28 LAB — I-STAT TROPONIN, ED
TROPONIN I, POC: 0.02 ng/mL (ref 0.00–0.08)
Troponin i, poc: 0.01 ng/mL (ref 0.00–0.08)

## 2016-11-28 LAB — BRAIN NATRIURETIC PEPTIDE: B NATRIURETIC PEPTIDE 5: 37.2 pg/mL (ref 0.0–100.0)

## 2016-11-28 MED ORDER — CLOPIDOGREL BISULFATE 75 MG PO TABS: 75.0000 mg | ORAL_TABLET | Freq: Every day | ORAL | 0 refills | Status: AC

## 2016-11-28 MED ORDER — CLOPIDOGREL BISULFATE 300 MG PO TABS
600.0000 mg | ORAL_TABLET | Freq: Once | ORAL | Status: AC
  Administered 2016-11-28: 600 mg via ORAL
  Filled 2016-11-28: qty 2

## 2016-11-28 MED ORDER — SODIUM CHLORIDE 0.9 % IV BOLUS (SEPSIS)
1000.0000 mL | Freq: Once | INTRAVENOUS | Status: AC
  Administered 2016-11-28: 1000 mL via INTRAVENOUS

## 2016-11-28 MED ORDER — IOPAMIDOL (ISOVUE-370) INJECTION 76%
INTRAVENOUS | Status: AC
  Administered 2016-11-28: 100 mL
  Filled 2016-11-28: qty 100

## 2016-11-28 MED ORDER — ASPIRIN 81 MG PO CHEW
324.0000 mg | CHEWABLE_TABLET | Freq: Once | ORAL | Status: AC
  Administered 2016-11-28: 324 mg via ORAL
  Filled 2016-11-28: qty 4

## 2016-11-28 NOTE — ED Triage Notes (Signed)
Pt reports hx of stents on 10/28. Pt having sob since last night, mild chest discomfort this am and took nitro pta.

## 2016-11-28 NOTE — ED Provider Notes (Signed)
[facility] EMERGENCY DEPARTMENT Provider Note   CSN: [id number] Arrival date & time: 11/28/16  0840     History   Chief Complaint Chief Complaint  Patient presents with  . Shortness of Breath    HPI Kristine Newman is a 41 y.o. female who presents to the ED with cc of SOB.  She is a past medical history of anxiety, hypertension, prediabetes and recent end STEMI.  The patient had 3 drug-eluting stents placed after cardiac catheterization and hospitalization on 11/15/2016.  Patient followed up with her cardiologist yesterday and has been doing well.  Patient states that last night she began having some shortness of breath.  She awoke from sleep feeling like she could not breathe.  She states that she was sweating.  She used 2 puffs on her inhaler which she was given for recent URI DX diagnosis.  She states that she was able to lay back down and go to sleep however when she woke this morning and got up she still felt like she could not take a full breath.  She denies exertional dyspnea, orthopnea.  She denies swelling in her ankles or weight gain.  She denies wheezing.  She has had a persistent cough and nasal congestion.  Patient feels some chest discomfort but she denies any chest pain, tightness, heaviness.  She is admittedly anxious and tearful.  Patient denies any nausea, vomiting.  She denies any pain in her jaw, arms.  She denies paresthesia.  The patient is on Brilinta and aspirin daily.  She denies unilateral leg swelling, hemoptysis, history of pulmonary embolus.  HPI  Past Medical History:  Diagnosis Date  . Anxiety   . High cholesterol   . Hypertension   . NSTEMI (non-ST elevated myocardial infarction) (HCC) 11/15/2016  . Pre-diabetes     Patient Active Problem List   Diagnosis Date Noted  . Unstable angina (HCC) 11/15/2016    Past Surgical History:  Procedure Laterality Date  . CORONARY ANGIOPLASTY WITH STENT PLACEMENT      OB History    No data  available       Home Medications    Prior to Admission medications   Medication Sig Start Date End Date Taking? Authorizing Provider  acetaminophen (TYLENOL) 500 MG tablet Take 500 mg every 6 (six) hours as needed by mouth for mild pain.   Yes [provider]  amLODipine (NORVASC) 2.5 MG tablet Take 2.5 mg by mouth daily.   Yes [provider]  aspirin (ASPIRIN CHILDRENS) 81 MG chewable tablet Chew 1 tablet (81 mg total) by mouth daily. 11/17/16 11/17/17 Yes Patwardhan, Manish J, MD  atorvastatin (LIPITOR) 80 MG tablet Take 1 tablet (80 mg total) by mouth daily at 6 PM. 11/20/16  Yes Patwardhan, Manish J, MD  fluticasone (FLONASE) 50 MCG/ACT nasal spray Place 2 sprays daily as needed into both nostrils for allergies.    Yes [provider]  guaiFENesin (MUCINEX) 600 MG 12 hr tablet Take 600 mg by mouth 2 (two) times daily as needed for to loosen phlegm.   Yes [provider]  ipratropium (ATROVENT) 0.06 % nasal spray Place 2 sprays into the nose daily as needed for rhinitis.  09/30/16  Yes [provider]  lisinopril (PRINIVIL,ZESTRIL) 10 MG tablet Take 1 tablet (10 mg total) by mouth daily. 11/17/16  Yes Patwardhan, Manish J, MD  metoprolol tartrate (LOPRESSOR) 50 MG tablet Take 1 tablet (50 mg total) by mouth 2 (two) times daily. 11/17/16  Yes Patwardhan, Manish J, MD  nitroGLYCERIN (NITROSTAT) 0.4 MG SL tablet Place 1 tablet (0.4 mg total) under the tongue every 5 (five) minutes as needed for chest pain. 11/20/16  Yes Patwardhan, Anabel Bene, MD  PROAIR HFA 108 (90 Base) MCG/ACT inhaler Take 2 puffs by mouth every 4 (four) hours as needed for wheezing or shortness of breath. 11/04/16  Yes [provider]  Vitamin D, Ergocalciferol, (DRISDOL) 50000 units CAPS capsule Take 50,000 Units by mouth every Tuesday.  10/12/16  Yes [provider]  aspirin 81 MG chewable tablet Chew 1 tablet (81 mg total) by mouth daily. Patient not taking:  Reported on 11/28/2016 11/20/16   Elder Negus, MD  clopidogrel (PLAVIX) 75 MG tablet Take 1 tablet (75 mg total) daily by mouth. 11/28/16 12/28/16  Alvira Monday, MD  lisinopril (PRINIVIL,ZESTRIL) 10 MG tablet Take 1 tablet (10 mg total) by mouth daily. Patient not taking: Reported on 11/28/2016 11/20/16   Elder Negus, MD  pantoprazole (PROTONIX) 40 MG tablet Take 40 mg daily by mouth. 11/27/16   [provider]    Family History Family History  Problem Relation Age of Onset  . Breast cancer Maternal Aunt     Social History Social History   Tobacco Use  . Smoking status: Never Smoker  . Smokeless tobacco: Never Used  Substance Use Topics  . Alcohol use: Yes    Alcohol/week: 0.6 oz    Types: 1 Shots of liquor per week  . Drug use: No     Allergies   Crestor [rosuvastatin]   Review of Systems Review of Systems  Ten systems reviewed and are negative for acute change, except as noted in the HPI.   Physical Exam Updated Vital Signs BP 111/79   Pulse 63   Temp 98.5 F (36.9 C) (Oral)   Resp 18   Ht 5\' 3"  (1.6 m)   Wt 89.4 kg (197 lb)   SpO2 99%   BMI 34.90 kg/m   Physical Exam  Constitutional: She is oriented to person, place, and time. She appears well-developed and well-nourished. No distress.  HENT:  Head: Normocephalic and atraumatic.  Eyes: Conjunctivae and EOM are normal. Pupils are equal, round, and reactive to light. No scleral icterus.  Neck: Normal range of motion. Neck supple. No JVD present.  Cardiovascular: Normal rate, regular rhythm and normal heart sounds. Exam reveals no gallop and no friction rub.  No murmur heard. No peripheral edema  Pulmonary/Chest: Effort normal and breath sounds normal. No respiratory distress. She has no decreased breath sounds. She has no wheezes. She has no rhonchi. She has no rales.  Abdominal: Soft. Bowel sounds are normal. She exhibits no distension and no mass. There is no tenderness. There  is no guarding.  Neurological: She is alert and oriented to person, place, and time.  Skin: Skin is warm and dry. Capillary refill takes more than 3 seconds. She is not diaphoretic.  Psychiatric: Her behavior is normal.  Nursing note and vitals reviewed.    ED Treatments / Results  Labs (all labs ordered are listed, but only abnormal results are displayed) Labs Reviewed  BASIC METABOLIC PANEL - Abnormal; Notable for the following components:      Result Value   CO2 19 (*)    Creatinine, Ser 1.07 (*)    All other components within normal limits  CBC  BRAIN NATRIURETIC PEPTIDE  I-STAT TROPONIN, ED  I-STAT TROPONIN, ED    EKG  EKG Interpretation  Date/Time:  Saturday November 28 2016 08:47:36 EST Ventricular Rate:  70 PR Interval:  158 QRS Duration: 92 QT Interval:  394 QTC Calculation: 425 R Axis:   47 Text Interpretation:  Normal sinus rhythm Septal infarct , age undetermined Abnormal ECG No significant change since last tracing Confirmed by Alvira MondaySchlossman, Erin ([id number]) on 11/28/2016 9:43:45 AM       Radiology Dg Chest 2 View  Result Date: 11/28/2016 CLINICAL DATA:  Shortness of breath and chest pain EXAM: CHEST  2 VIEW COMPARISON:  Chest radiograph and chest CT November 15, 2016 FINDINGS: There is no edema or consolidation. The heart size and pulmonary vascularity are normal. No adenopathy. No pneumothorax. No bone lesions. IMPRESSION: No edema or consolidation. Electronically Signed   By: Bretta BangWilliam  Woodruff III M.D.   On: 11/28/2016 09:02   Ct Angio Chest Pe W And/or Wo Contrast  Result Date: 11/28/2016 CLINICAL DATA:  Shortness of breath. Evaluate for pulmonary embolism. EXAM: CT ANGIOGRAPHY CHEST WITH CONTRAST TECHNIQUE: Multidetector CT imaging of the chest was performed using the standard protocol during bolus administration of intravenous contrast. Multiplanar CT image reconstructions and MIPs were obtained to evaluate the vascular anatomy. CONTRAST:  100mL ISOVUE-370  IOPAMIDOL (ISOVUE-370) INJECTION 76% COMPARISON:  Chest CT - 11/15/2016 FINDINGS: Vascular Findings: There is adequate opacification of the pulmonary arterial system with the main pulmonary artery measuring 372 Hounsfield units. There are no discrete filling defects within the pulmonary arterial tree to suggest pulmonary embolism. Normal caliber of the main pulmonary artery. Borderline cardiomegaly. Post coronary stent placement. No pericardial effusion. Normal caliber the thoracic aorta. No definite thoracic aortic dissection or periaortic stranding on this nongated examination. Conventional configuration of the aortic arch. The branch vessels of the aortic arch appear patent throughout their imaged course. Review of the MIP images confirms the above findings. ---------------------------------------------------------------------------------- Nonvascular Findings: Mediastinum/Lymph Nodes: No bulky mediastinal, hilar axillary lymphadenopathy. Lungs/Pleura: Minimal dependent subpleural ground-glass atelectasis. No focal airspace opacities. No pleural effusion or pneumothorax. The central pulmonary airways appear widely patent. No discrete pulmonary nodules. Upper abdomen: Limited early arterial phase evaluation of the upper abdomen is normal. Musculoskeletal: No acute or aggressive osseous abnormalities. Regional soft tissues appear normal. Normal appearance of the thyroid gland. IMPRESSION: 1. No acute cardiopulmonary disease. Specifically, no evidence of pulmonary embolism. 2. Post coronary stent placement. Electronically Signed   By: Simonne ComeJohn  Watts M.D.   On: 11/28/2016 13:03    Procedures Procedures (including critical care time)  Medications Ordered in ED Medications  aspirin chewable tablet 324 mg (324 mg Oral Given 11/28/16 0939)  sodium chloride 0.9 % bolus 1,000 mL (0 mLs Intravenous Stopped 11/28/16 1431)  iopamidol (ISOVUE-370) 76 % injection (100 mLs  Contrast Given 11/28/16 1235)  clopidogrel  (PLAVIX) tablet 600 mg (600 mg Oral Given 11/28/16 1510)     Initial Impression / Assessment and Plan / ED Course  I have reviewed the triage vital signs and the nursing notes.  Pertinent labs & imaging results that were available during my care of the patient were reviewed by me and considered in my medical decision making (see chart for details).  Clinical Course as of Nov 29 1630  Sat Nov 28, 2016  1045 Patient work up shows no evidence of ACS, Pulmonary edema or CHF. [facility] and would likely have elevated D-Dimer secondary to recent cardiac cath. Patient will need CTA to r/o PE.   [AH]    Clinical Course User Index [AH] Tiburcio PeaHarris,  Cammy CopaAbigail, PA-C    Patient with negative CT angios.  2- troponins here in the emergency department.  Seen and shared visit with Dr. Dalene SeltzerSchlossman.  We have consulted with her cardiologist who recommends changing her from Brilinta which has been shown to cause some subjective shortness of breath in some patients to Plavix.  She is to follow closely with her cardiologist.  I doubt other emergent etiology of her shortness of breath.  She does not have hypoxia and is hemodynamically stable.  She appears appropriate for discharge at this time.  Final Clinical Impressions(s) / ED Diagnoses   Final diagnoses:  Shortness of breath    ED Discharge Orders        Ordered    clopidogrel (PLAVIX) 75 MG tablet  Daily     11/28/16 1457       Arthor CaptainHarris, Ardene Remley, PA-C 11/28/16 1632    Alvira MondaySchlossman, Erin, MD 12/03/16 2137

## 2016-11-28 NOTE — ED Notes (Signed)
Family member reports that patient had stents placed earlier this week, and takes medication specifically at 0900 and 2100. Family has medication in hand. Will notify EDP.

## 2016-11-28 NOTE — ED Notes (Signed)
Patient transported to CT 

## 2016-11-28 NOTE — ED Notes (Signed)
Paged Dr. Jacinto HalimGanji to [phone]

## 2016-11-28 NOTE — Discharge Instructions (Signed)
Contact a health care provider if: °Your condition does not improve as soon as expected. °You have a hard time doing your normal activities, even after you rest. °You have new symptoms. °Get help right away if: °Your shortness of breath gets worse. °You have shortness of breath when you are resting. °You feel light-headed or you faint. °You have a cough that is not controlled with medicines. °You cough up blood. °You have pain with breathing. °You have pain in your chest, arms, shoulders, or abdomen. °You have a fever. °You cannot walk up stairs or exercise the way that you normally do. °

## 2016-11-30 DIAGNOSIS — I10 Essential (primary) hypertension: Secondary | ICD-10-CM | POA: Insufficient documentation

## 2016-11-30 NOTE — Telephone Encounter (Signed)
Faxed over office notes request form to Dr.Ganji's office. Called office to see when next f/u appt is - Patient has not made one. Paperwork in F/U folder.

## 2016-12-25 ENCOUNTER — Telehealth (HOSPITAL_COMMUNITY): Payer: Self-pay

## 2016-12-25 NOTE — Telephone Encounter (Signed)
Attempted to call patient in regards to Cardiac Rehab - Lm on vm °

## 2016-12-29 ENCOUNTER — Telehealth (HOSPITAL_COMMUNITY): Payer: Self-pay

## 2016-12-29 NOTE — Telephone Encounter (Signed)
Patient returned phone call - Patient is interested in the Cardiac Rehab Program. Scheduled orientation on 01/28/2017 at 8:45am. Patient will attend the 2:45pm exc class.

## 2017-01-26 ENCOUNTER — Telehealth (HOSPITAL_COMMUNITY): Payer: Self-pay

## 2017-01-28 ENCOUNTER — Encounter (HOSPITAL_COMMUNITY): Payer: Self-pay

## 2017-01-28 ENCOUNTER — Ambulatory Visit (HOSPITAL_COMMUNITY)
Admission: RE | Admit: 2017-01-28 | Discharge: 2017-01-28 | Disposition: A | Discharge status: Home / Self Care | Payer: BC Managed Care – PPO | Source: Ambulatory Visit | Attending: Cardiology | Admitting: Cardiology

## 2017-01-28 ENCOUNTER — Encounter (HOSPITAL_COMMUNITY)
Admission: RE | Admit: 2017-01-28 | Discharge: 2017-01-28 | Disposition: A | Discharge status: Home / Self Care | Payer: BC Managed Care – PPO | Source: Ambulatory Visit | Attending: Cardiology | Admitting: Cardiology

## 2017-01-28 VITALS — Ht 63.0 in | Wt 192.2 lb

## 2017-01-28 DIAGNOSIS — Z48812 Encounter for surgical aftercare following surgery on the circulatory system: Secondary | ICD-10-CM | POA: Diagnosis not present

## 2017-01-28 DIAGNOSIS — R001 Bradycardia, unspecified: Secondary | ICD-10-CM | POA: Diagnosis not present

## 2017-01-28 DIAGNOSIS — R079 Chest pain, unspecified: Secondary | ICD-10-CM | POA: Diagnosis not present

## 2017-01-28 DIAGNOSIS — I214 Non-ST elevation (NSTEMI) myocardial infarction: Secondary | ICD-10-CM | POA: Diagnosis present

## 2017-01-28 DIAGNOSIS — Z955 Presence of coronary angioplasty implant and graft: Secondary | ICD-10-CM | POA: Diagnosis not present

## 2017-01-28 NOTE — Progress Notes (Signed)
Cardiac Rehab Medication Review  Does the patient  feel that his/her medications are working for him/her? YES  Has the patient been experiencing any side effects to the medications prescribed?   NO  Does the patient measure his/her own blood pressure or blood glucose at home?   NO   Does the patient have any problems obtaining medications due to transportation or finances?    NO  Understanding of regimen: excellent Understanding of indications: excellent Potential of compliance: excellent    comments: Reviewed medication list with pt. Updated information with most recent changes. Pt reports no issues with taking medication.  Pt took one ntg back in November.  Pt has talked with Dr. Rosemary HolmsPatwardhan and he feels it was anxiety related and attend cardiac rehab would be good for him.    Kristine Newman Kristine ChestnutBailey Cayden Granholm RN 01/28/2017 9:24 AM

## 2017-01-28 NOTE — Progress Notes (Signed)
Cardiac Individual Treatment Plan  Patient Details  Name: Kristine Newman MRN: [id number] Date of Birth: 07-31-1975 Referring Provider:     CARDIAC REHAB PHASE II ORIENTATION from 01/28/2017 in [facility] CARDIAC REHAB  Referring Provider  Truett Mainland MD      Initial Encounter Date:    CARDIAC REHAB PHASE II ORIENTATION from 01/28/2017 in [facility] CARDIAC REHAB  Date  01/28/17  Referring Provider  Truett Mainland MD      Visit Diagnosis: NSTEMI (non-ST elevated myocardial infarction) [facility])  Status post coronary artery stent placement  Patient's Home Medications on Admission:  Current Outpatient Medications:  .  acetaminophen (TYLENOL) 500 MG tablet, Take 500 mg every 6 (six) hours as needed by mouth for mild pain., Disp: , Rfl:  .  aspirin (ASPIRIN CHILDRENS) 81 MG chewable tablet, Chew 1 tablet (81 mg total) by mouth daily., Disp: 90 tablet, Rfl: 3 .  aspirin 81 MG chewable tablet, Chew 1 tablet (81 mg total) by mouth daily., Disp: 90 tablet, Rfl: 3 .  atorvastatin (LIPITOR) 80 MG tablet, Take 1 tablet (80 mg total) by mouth daily at 6 PM., Disp: 90 tablet, Rfl: 3 .  clopidogrel (PLAVIX) 75 MG tablet, Take 75 mg by mouth daily., Disp: , Rfl:  .  fluticasone (FLONASE) 50 MCG/ACT nasal spray, Place 2 sprays daily as needed into both nostrils for allergies. , Disp: , Rfl:  .  guaiFENesin (MUCINEX) 600 MG 12 hr tablet, Take 600 mg by mouth 2 (two) times daily as needed for to loosen phlegm., Disp: , Rfl:  .  ipratropium (ATROVENT) 0.06 % nasal spray, Place 2 sprays into the nose daily as needed for rhinitis. , Disp: , Rfl: 5 .  lisinopril (PRINIVIL,ZESTRIL) 10 MG tablet, Take 1 tablet (10 mg total) by mouth daily. (Patient taking differently: Take 20 mg by mouth daily. ), Disp: 90 tablet, Rfl: 3 .  metoprolol tartrate (LOPRESSOR) 50 MG tablet, Take 1 tablet (50 mg total) by mouth 2 (two) times daily. (Patient taking differently: Take 25 mg by  mouth 2 (two) times daily. ), Disp: 60 tablet, Rfl: 3 .  nitroGLYCERIN (NITROSTAT) 0.4 MG SL tablet, Place 1 tablet (0.4 mg total) under the tongue every 5 (five) minutes as needed for chest pain., Disp: 30 tablet, Rfl: 3 .  pantoprazole (PROTONIX) 40 MG tablet, Take 40 mg daily by mouth., Disp: , Rfl:  .  PROAIR HFA 108 (90 Base) MCG/ACT inhaler, Take 2 puffs by mouth every 4 (four) hours as needed for wheezing or shortness of breath., Disp: , Rfl: 0 .  Vitamin D, Ergocalciferol, (DRISDOL) 50000 units CAPS capsule, Take 50,000 Units by mouth every Tuesday. , Disp: , Rfl: 0  Past Medical History: Past Medical History:  Diagnosis Date  . Anxiety   . High cholesterol   . Hypertension   . NSTEMI (non-ST elevated myocardial infarction) (HCC) 11/15/2016  . Pre-diabetes     Tobacco Use: Social History   Tobacco Use  Smoking Status Never Smoker  Smokeless Tobacco Never Used    Labs: Recent Review Advice worker    Labs for ITP Cardiac and Pulmonary Rehab Latest Ref Rng & Units 11/16/2016   Cholestrol 0 - 200 mg/dL 782(N)   LDLCALC 0 - 99 mg/dL 562(Z)   HDL >30 mg/dL 45   Trlycerides <865 mg/dL 784(O)   Hemoglobin N6E 4.8 - 5.6 % 6.3(H)      Capillary Blood Glucose: Lab Results  Component Value Date  GLUCAP 85 11/20/2016   GLUCAP 78 11/19/2016   GLUCAP 82 11/19/2016   GLUCAP 76 11/19/2016   GLUCAP 96 11/19/2016     Exercise Target Goals: Date: 01/28/17  Exercise Program Goal: Individual exercise prescription set with THRR, safety & activity barriers. Participant demonstrates ability to understand and report RPE using BORG scale, to self-measure pulse accurately, and to acknowledge the importance of the exercise prescription.  Exercise Prescription Goal: Starting with aerobic activity 30 plus minutes a day, 3 days per week for initial exercise prescription. Provide home exercise prescription and guidelines that participant acknowledges understanding prior to  discharge.  Activity Barriers & Risk Stratification: Activity Barriers & Cardiac Risk Stratification - 01/28/17 1208      Activity Barriers & Cardiac Risk Stratification   Activity Barriers  Deconditioning;Muscular Weakness;Shortness of Breath;Chest Pain/Angina    Cardiac Risk Stratification  High       6 Minute Walk: 6 Minute Walk    Row Name 01/28/17 0928 01/28/17 0947 01/28/17 1208     6 Minute Walk   Phase  Initial  -  -   Distance  1210 feet  -  -   Walk Time  6 minutes  -  -   # of Rest Breaks  0  -  -   MPH  -  2.3  -   METS  -  3.6  -   RPE  11  -  -   Perceived Dyspnea   1  -  -   VO2 Peak  -  12.45  -   Symptoms  Yes (comment)  -  -   Comments  SOB   -  -   Resting HR  -  55 bpm  -   Resting BP  112/64  112/64  -   Resting Oxygen Saturation   98 %  -  -   Exercise Oxygen Saturation  during 6 min walk  99 %  -  -   Max Ex. HR  -  83 bpm  -   Max Ex. BP  118/62  -  -   2 Minute Post BP  -  -  110/68      Oxygen Initial Assessment:   Oxygen Re-Evaluation:   Oxygen Discharge (Final Oxygen Re-Evaluation):   Initial Exercise Prescription: Initial Exercise Prescription - 01/28/17 1200      Date of Initial Exercise RX and Referring Provider   Date  01/28/17    Referring Provider  Truett Mainland MD      Treadmill   MPH  2.3    Grade  0    Minutes  10    METs  2.76      Recumbant Bike   Level  1.5 upright scifit    Minutes  10    METs  2      NuStep   Level  2    SPM  70    Minutes  10    METs  2      Prescription Details   Frequency (times per week)  3    Duration  Progress to 30 minutes of continuous aerobic without signs/symptoms of physical distress      Intensity   THRR 40-80% of Max Heartrate  71-142    Ratings of Perceived Exertion  11-15    Perceived Dyspnea  0-4      Progression   Progression  Continue to progress workloads to maintain intensity without signs/symptoms of physical  distress.      Resistance Training    Training Prescription  Yes    Weight  2lbs    Reps  10-15       Perform Capillary Blood Glucose checks as needed.  Exercise Prescription Changes:   Exercise Comments:   Exercise Goals and Review: Exercise Goals    Row Name 01/28/17 0929             Exercise Goals   Increase Physical Activity  Yes       Intervention  Provide advice, education, support and counseling about physical activity/exercise needs.;Develop an individualized exercise prescription for aerobic and resistive training based on initial evaluation findings, risk stratification, comorbidities and participant's personal goals.       Expected Outcomes  Achievement of increased cardiorespiratory fitness and enhanced flexibility, muscular endurance and strength shown through measurements of functional capacity and personal statement of participant.       Increase Strength and Stamina  Yes       Intervention  Provide advice, education, support and counseling about physical activity/exercise needs.;Develop an individualized exercise prescription for aerobic and resistive training based on initial evaluation findings, risk stratification, comorbidities and participant's personal goals.       Expected Outcomes  Achievement of increased cardiorespiratory fitness and enhanced flexibility, muscular endurance and strength shown through measurements of functional capacity and personal statement of participant.       Able to understand and use rate of perceived exertion (RPE) scale  Yes       Intervention  Provide education and explanation on how to use RPE scale       Expected Outcomes  Short Term: Able to use RPE daily in rehab to express subjective intensity level;Long Term:  Able to use RPE to guide intensity level when exercising independently       Knowledge and understanding of Target Heart Rate Range (THRR)  Yes       Intervention  Provide education and explanation of THRR including how the numbers were predicted and where  they are located for reference       Expected Outcomes  Short Term: Able to state/look up THRR;Short Term: Able to use daily as guideline for intensity in rehab;Long Term: Able to use THRR to govern intensity when exercising independently       Able to check pulse independently  Yes       Intervention  Provide education and demonstration on how to check pulse in carotid and radial arteries.;Review the importance of being able to check your own pulse for safety during independent exercise       Expected Outcomes  Short Term: Able to explain why pulse checking is important during independent exercise;Long Term: Able to check pulse independently and accurately       Understanding of Exercise Prescription  Yes       Intervention  Provide education, explanation, and written materials on patient's individual exercise prescription       Expected Outcomes  Short Term: Able to explain program exercise prescription;Long Term: Able to explain home exercise prescription to exercise independently          Exercise Goals Re-Evaluation :    Discharge Exercise Prescription (Final Exercise Prescription Changes):   Nutrition:  Target Goals: Understanding of nutrition guidelines, daily intake of sodium 1500mg , cholesterol 200mg , calories 30% from fat and 7% or less from saturated fats, daily to have 5 or more servings of fruits and vegetables.  Biometrics: Pre Biometrics - 01/28/17  [id number]      Pre Biometrics   Height  5\' 3"  (1.6 m)    Weight  192 lb 3.9 oz (87.2 kg)    Waist Circumference  41.5 inches    Hip Circumference  43.5 inches    Waist to Hip Ratio  0.95 %    BMI (Calculated)  34.06    Triceps Skinfold  33 mm    % Body Fat  43.9 %    Grip Strength  33 kg    Flexibility  14 in    Single Leg Stand  20.28 seconds        Nutrition Therapy Plan and Nutrition Goals: Nutrition Therapy & Goals - 01/28/17 1054      Nutrition Therapy   Diet  Heart Healthy      Personal Nutrition Goals    Nutrition Goal  Pt to identify and limit food sources of saturated fat, trans fat, and sodium    Personal Goal #2  Pt to identify food quantities necessary to achieve weight loss of 6-24 lb (2.7-10.9 kg) at graduation from cardiac rehab.        Intervention Plan   Intervention  Prescribe, educate and counsel regarding individualized specific dietary modifications aiming towards targeted core components such as weight, hypertension, lipid management, diabetes, heart failure and other comorbidities.    Expected Outcomes  Short Term Goal: Understand basic principles of dietary content, such as calories, fat, sodium, cholesterol and nutrients.;Long Term Goal: Adherence to prescribed nutrition plan.       Nutrition Discharge: Nutrition Scores: Nutrition Assessments - 01/28/17 1055      MEDFICTS Scores   Pre Score  33       Nutrition Goals Re-Evaluation:   Nutrition Goals Re-Evaluation:   Nutrition Goals Discharge (Final Nutrition Goals Re-Evaluation):   Psychosocial: Target Goals: Acknowledge presence or absence of significant depression and/or stress, maximize coping skills, provide positive support system. Participant is able to verbalize types and ability to use techniques and skills needed for reducing stress and depression.  Initial Review & Psychosocial Screening: Initial Psych Review & Screening - 01/28/17 1259      Initial Review   Current issues with  Current Anxiety/Panic;Current Stress Concerns;Current Sleep Concerns    Source of Stress Concerns  Unable to participate in former interests or hobbies;Unable to perform yard/household activities;Chronic Illness    Comments  Pt reports feeling anxious regarding having another heart event      Family Dynamics   Good Support System?  Yes      Barriers   Psychosocial barriers to participate in program  The patient should benefit from training in stress management and relaxation.      Screening Interventions   Interventions   To provide support and resources with identified psychosocial needs;Encouraged to exercise       Quality of Life Scores: Quality of Life - 01/28/17 0949      Quality of Life Scores   Health/Function Pre  16.89 %    Socioeconomic Pre  22.21 %    Psych/Spiritual Pre  22.21 %    Family Pre  23.63 %    GLOBAL Pre  20.06 %       PHQ-9: Recent Review Flowsheet Data    There is no flowsheet data to display.     Interpretation of Total Score  Total Score Depression Severity:  1-4 = Minimal depression, 5-9 = Mild depression, 10-14 = Moderate depression, 15-19 = Moderately severe depression, 20-27 = Severe depression  Psychosocial Evaluation and Intervention: Psychosocial Evaluation - 01/28/17 1302      Psychosocial Evaluation & Interventions   Interventions  Therapist referral;Physician referral;Relaxation education;Stress management education;Encouraged to exercise with the program and follow exercise prescription    Comments  Pt to restablish with Psychologist that she has seen in the past.  Pt to ask primary MD for anxiety medication that was discussed at the last follow up.  Appointment made with chaplain at spiritual care depart for Monday 1/4 after exercise.    Expected Outcomes  Pt will seek help when needed and establish positive and healthy coping skills to contribute in a meaningful way to her community.    Continue Psychosocial Services   Follow up required by staff       Psychosocial Re-Evaluation:   Psychosocial Discharge (Final Psychosocial Re-Evaluation):   Vocational Rehabilitation: Provide vocational rehab assistance to qualifying candidates.   Vocational Rehab Evaluation & Intervention: Vocational Rehab - 01/28/17 1306      Initial Vocational Rehab Evaluation & Intervention   Assessment shows need for Vocational Rehabilitation  No Pt has returned to work as a Lexicographer at [employer]       Education: Education Goals: Education classes will be  provided on a weekly basis, covering required topics. Participant will state understanding/return demonstration of topics presented.  Learning Barriers/Preferences: Learning Barriers/Preferences - 01/28/17 1610      Learning Barriers/Preferences   Learning Barriers  Sight    Learning Preferences  Written Material       Education Topics: Count Your Pulse:  -Group instruction provided by verbal instruction, demonstration, patient participation and written materials to support subject.  Instructors address importance of being able to find your pulse and how to count your pulse when at home without a heart monitor.  Patients get hands on experience counting their pulse with staff help and individually.   Heart Attack, Angina, and Risk Factor Modification:  -Group instruction provided by verbal instruction, video, and written materials to support subject.  Instructors address signs and symptoms of angina and heart attacks.    Also discuss risk factors for heart disease and how to make changes to improve heart health risk factors.   Functional Fitness:  -Group instruction provided by verbal instruction, demonstration, patient participation, and written materials to support subject.  Instructors address safety measures for doing things around the house.  Discuss how to get up and down off the floor, how to pick things up properly, how to safely get out of a chair without assistance, and balance training.   Meditation and Mindfulness:  -Group instruction provided by verbal instruction, patient participation, and written materials to support subject.  Instructor addresses importance of mindfulness and meditation practice to help reduce stress and improve awareness.  Instructor also leads participants through a meditation exercise.    Stretching for Flexibility and Mobility:  -Group instruction provided by verbal instruction, patient participation, and written materials to support subject.   Instructors lead participants through series of stretches that are designed to increase flexibility thus improving mobility.  These stretches are additional exercise for major muscle groups that are typically performed during regular warm up and cool down.   Hands Only CPR:  -Group verbal, video, and participation provides a basic overview of AHA guidelines for community CPR. Role-play of emergencies allow participants the opportunity to practice calling for help and chest compression technique with discussion of AED use.   Hypertension: -Group verbal and written instruction that provides a basic  overview of hypertension including the most recent diagnostic guidelines, risk factor reduction with self-care instructions and medication management.    Nutrition I class: Heart Healthy Eating:  -Group instruction provided by PowerPoint slides, verbal discussion, and written materials to support subject matter. The instructor gives an explanation and review of the Therapeutic Lifestyle Changes diet recommendations, which includes a discussion on lipid goals, dietary fat, sodium, fiber, plant stanol/sterol esters, sugar, and the components of a well-balanced, healthy diet.   CARDIAC REHAB PHASE II ORIENTATION from 01/28/2017 in [facility] CARDIAC REHAB  Date  01/28/17  Educator  RD  Instruction Review Code  Not applicable      Nutrition II class: Lifestyle Skills:  -Group instruction provided by PowerPoint slides, verbal discussion, and written materials to support subject matter. The instructor gives an explanation and review of label reading, grocery shopping for heart health, heart healthy recipe modifications, and ways to make healthier choices when eating out.   CARDIAC REHAB PHASE II ORIENTATION from 01/28/2017 in [facility] CARDIAC REHAB  Date  01/28/17  Educator  RD  Instruction Review Code  Not applicable      Diabetes Question & Answer:  -Group  instruction provided by PowerPoint slides, verbal discussion, and written materials to support subject matter. The instructor gives an explanation and review of diabetes co-morbidities, pre- and post-prandial blood glucose goals, pre-exercise blood glucose goals, signs, symptoms, and treatment of hypoglycemia and hyperglycemia, and foot care basics.   Diabetes Blitz:  -Group instruction provided by PowerPoint slides, verbal discussion, and written materials to support subject matter. The instructor gives an explanation and review of the physiology behind type 1 and type 2 diabetes, diabetes medications and rational behind using different medications, pre- and post-prandial blood glucose recommendations and Hemoglobin A1c goals, diabetes diet, and exercise including blood glucose guidelines for exercising safely.    Portion Distortion:  -Group instruction provided by PowerPoint slides, verbal discussion, written materials, and food models to support subject matter. The instructor gives an explanation of serving size versus portion size, changes in portions sizes over the last 20 years, and what consists of a serving from each food group.   Stress Management:  -Group instruction provided by verbal instruction, video, and written materials to support subject matter.  Instructors review role of stress in heart disease and how to cope with stress positively.     Exercising on Your Own:  -Group instruction provided by verbal instruction, power point, and written materials to support subject.  Instructors discuss benefits of exercise, components of exercise, frequency and intensity of exercise, and end points for exercise.  Also discuss use of nitroglycerin and activating EMS.  Review options of places to exercise outside of rehab.  Review guidelines for sex with heart disease.   Cardiac Drugs I:  -Group instruction provided by verbal instruction and written materials to support subject.  Instructor  reviews cardiac drug classes: antiplatelets, anticoagulants, beta blockers, and statins.  Instructor discusses reasons, side effects, and lifestyle considerations for each drug class.   Cardiac Drugs II:  -Group instruction provided by verbal instruction and written materials to support subject.  Instructor reviews cardiac drug classes: angiotensin converting enzyme inhibitors (ACE-I), angiotensin II receptor blockers (ARBs), nitrates, and calcium channel blockers.  Instructor discusses reasons, side effects, and lifestyle considerations for each drug class.   Anatomy and Physiology of the Circulatory System:  Group verbal and written instruction and models provide basic cardiac anatomy and physiology, with the coronary electrical  and arterial systems. Review of: AMI, Angina, Valve disease, Heart Failure, Peripheral Artery Disease, Cardiac Arrhythmia, Pacemakers, and the ICD.   Other Education:  -Group or individual verbal, written, or video instructions that support the educational goals of the cardiac rehab program.   Knowledge Questionnaire Score: Knowledge Questionnaire Score - 01/28/17 0849      Knowledge Questionnaire Score   Pre Score  20/24       Core Components/Risk Factors/Patient Goals at Admission: Personal Goals and Risk Factors at Admission - 01/28/17 1211      Core Components/Risk Factors/Patient Goals on Admission    Weight Management  Yes;Obesity;Weight Maintenance;Weight Loss    Intervention  Weight Management: Develop a combined nutrition and exercise program designed to reach desired caloric intake, while maintaining appropriate intake of nutrient and fiber, sodium and fats, and appropriate energy expenditure required for the weight goal.;Weight Management: Provide education and appropriate resources to help participant work on and attain dietary goals.;Weight Management/Obesity: Establish reasonable short term and long term weight goals.;Obesity: Provide education and  appropriate resources to help participant work on and attain dietary goals.    Admit Weight  192 lb 3.9 oz (87.2 kg)    Goal Weight: Short Term  185 lb (83.9 kg)    Goal Weight: Long Term  155 lb (70.3 kg)    Expected Outcomes  Short Term: Continue to assess and modify interventions until short term weight is achieved;Long Term: Adherence to nutrition and physical activity/exercise program aimed toward attainment of established weight goal;Weight Maintenance: Understanding of the daily nutrition guidelines, which includes 25-35% calories from fat, 7% or less cal from saturated fats, less than 200mg  cholesterol, less than 1.5gm of sodium, & 5 or more servings of fruits and vegetables daily;Weight Loss: Understanding of general recommendations for a balanced deficit meal plan, which promotes 1-2 lb weight loss per week and includes a negative energy balance of [phone] kcal/d;Understanding recommendations for meals to include 15-35% energy as protein, 25-35% energy from fat, 35-60% energy from carbohydrates, less than 200mg  of dietary cholesterol, 20-35 gm of total fiber daily;Understanding of distribution of calorie intake throughout the day with the consumption of 4-5 meals/snacks    Hypertension  Yes    Intervention  Provide education on lifestyle modifcations including regular physical activity/exercise, weight management, moderate sodium restriction and increased consumption of fresh fruit, vegetables, and low fat dairy, alcohol moderation, and smoking cessation.;Monitor prescription use compliance.    Expected Outcomes  Short Term: Continued assessment and intervention until BP is < 140/66mm HG in hypertensive participants. < 130/44mm HG in hypertensive participants with diabetes, heart failure or chronic kidney disease.;Long Term: Maintenance of blood pressure at goal levels.    Lipids  Yes    Intervention  Provide education and support for participant on nutrition & aerobic/resistive exercise along  with prescribed medications to achieve LDL 70mg , HDL >40mg .    Expected Outcomes  Short Term: Participant states understanding of desired cholesterol values and is compliant with medications prescribed. Participant is following exercise prescription and nutrition guidelines.;Long Term: Cholesterol controlled with medications as prescribed, with individualized exercise RX and with personalized nutrition plan. Value goals: LDL < 70mg , HDL > 40 mg.    Stress  Yes    Intervention  Offer individual and/or small group education and counseling on adjustment to heart disease, stress management and health-related lifestyle change. Teach and support self-help strategies.;Refer participants experiencing significant psychosocial distress to appropriate mental health specialists for further evaluation and treatment. When possible, include family members  and significant others in education/counseling sessions.    Expected Outcomes  Short Term: Participant demonstrates changes in health-related behavior, relaxation and other stress management skills, ability to obtain effective social support, and compliance with psychotropic medications if prescribed.;Long Term: Emotional wellbeing is indicated by absence of clinically significant psychosocial distress or social isolation.    Personal Goal Other  Yes    Personal Goal  Decrease fear avoidance and anxiety about recent cardiac event. Build confidence and learn about exercise/activity limitations    Intervention  Provide counseling on stress management and coping mechanisms. Further discuss activity restrictions and exercise programming to build on confidence with activity.     Expected Outcomes  Pt will have increase confidence and motivation for physical actvity/exercise and decrease stress and worries about cardiac event. More importantly, pt will have a better understanding of activity limitations and reduce modifiable risk factors.        Core Components/Risk  Factors/Patient Goals Review:    Core Components/Risk Factors/Patient Goals at Discharge (Final Review):    ITP Comments: ITP Comments    Row Name 01/28/17 0926           ITP Comments  Dr. Armanda Magic, Medical Director          Comments:  Patient attended orientation from [phone] to 1100 to review rules and guidelines for program. Completed 6 minute walk test, Intitial ITP, and exercise prescription.  VSS. Telemetry- SR/SB with sinus arrythmias. Pt did not report any discomfort during the walk test but later reported that she was having 2/10 chest pain throughout the entire time she was in orientation.  12 lead EKG obtained, Dr. Rosemary Holms notified.  No further intervention needed.  Brief Psychosocial Assessment reveals high anxiety and fear of second heart event.  Pt given emotional support and encouragement.  appt made for pt to see Theda Belfast in spiritual care, message left for pt primary md for anxiety medication and pt is to reestablish with clinical psychologist she saw in the past.  Will follow up with patient on next week with exercise.  Pt is very excited to be in the cardiac rehab

## 2017-01-28 NOTE — Progress Notes (Signed)
Pt finishing orientation evaluation in cardiac rehab.  Pt reports to rehab staff that she is having 2/10 chest pain all morning. BP checked 104/60, pulse ox 100.  Pt with no visible distress and is joking and smiling at staff.  Called and spoke to Dr. Rosemary HolmsPatwardhan given update. 12 lead ekg requested.  Dr. Rosemary HolmsPatwardhan asked to speak with patient.  Pt verbalizes feeling very anxious although her non verbal is smiling and laughing.  Continue to provide support and encouragement toward pt. 12 lead ekg completed.  Pt noted that the discomfort is gone.  12 lead ekg and walk test information with vital signs faxed to Dr. Rosemary HolmsPatwardhan office for review.  Called and let office staff know I was sending over the information.  Talked with pt extensively about her feelings and anxiety level.  Pt stated that she sees Ronney Lionmarian Hazy NP who she saw in December and  thought she could benefit from taking medication for anxiety but did not prescribe because of all the new medications that were stared with her cardiac event.  Called and left message for marian to please contact to discuss pt anxiety.  Called and scheduled appt with Theda BelfastBob Hamilton, spiritual care department.  Pt will be seen after exercise on 1/14 at 4:10.  Pt reports that she also in the past see a psychologist.  Pt seen about a year ago and the psychologist is unaware she has had a MI.  Encourage pt to reestablish services with him since she had a life changing event.  Pt sent him a text and will await a response.  Called Dr. Rosemary HolmsPatwardhan office for his plan of care based upon evaluating [facility] 12 lead ekg. No further treatment needed.  Pt should keep her follow up appt on 2/8.  Pt updated with the plan and verbalized understanding and is agreement. Alanson Alyarlette Clarabelle Oscarson RN, BSN Cardiac and Emergency planning/management officerulmonary Rehab Nurse Navigator

## 2017-01-28 NOTE — Progress Notes (Signed)
Kristine Newman 42 y.o. female DOB: 09/03/1975 MRN: [id number]      Nutrition Note  1. NSTEMI (non-ST elevated myocardial infarction) (HCC)   2. Status post coronary artery stent placement    Past Medical History:  Diagnosis Date  . Anxiety   . High cholesterol   . Hypertension   . NSTEMI (non-ST elevated myocardial infarction) (HCC) 11/15/2016  . Pre-diabetes    Meds reviewed.   HT: Ht Readings from Last 1 Encounters:  01/28/17 5\' 3"  (1.6 m)    WT: Wt Readings from Last 3 Encounters:  01/28/17 192 lb 3.9 oz (87.2 kg)  11/28/16 197 lb (89.4 kg)  11/20/16 197 lb 12.8 oz (89.7 kg)     BMI 34.1   Current tobacco use? No   Labs:  Lipid Panel     Component Value Date/Time   CHOL 275 (H) 11/16/2016 0229   TRIG 199 (H) 11/16/2016 0229   HDL 45 11/16/2016 0229   CHOLHDL 6.1 11/16/2016 0229   VLDL 40 11/16/2016 0229   LDLCALC 190 (H) 11/16/2016 0229    Lab Results  Component Value Date   HGBA1C 6.3 (H) 11/16/2016   CBG (last 3)  No results for input(s): GLUCAP in the last 72 hours.  Nutrition Note Spoke with pt. Nutrition plan and goals reviewed with pt. Pt is following a Heart Healthy diet. Pt wants to lose wt. Wt loss tips reviewed. Pt is prediabetic. Pt is aware of prediabetes dx. Managing pre-diabetes with diet, exercise and wt loss/maintaining a healthy wt discussed. Pt expressed understanding of the information reviewed. Pt aware of nutrition education classes offered and plans on attending nutrition classes.  Nutrition Diagnosis ? Food-and nutrition-related knowledge deficit related to lack of exposure to information as related to diagnosis of: ? CVD ? Pre-DM ? Obesity related to excessive energy intake as evidenced by a BMI of 34.1  Nutrition Intervention ? Pt's individual nutrition plan and goals reviewed with pt. ? Pt given handouts for: ? Nutrition I class ? Nutrition II class   Nutrition Goal(s):  ? Pt to identify and limit food sources of saturated fat,  trans fat, and sodium ? Pt to identify food quantities necessary to achieve weight loss of 6-24 lb (2.7-10.9 kg) at graduation from cardiac rehab.    Plan:  Pt to attend nutrition classes ? Nutrition I ? Nutrition II ? Portion Distortion  Will provide client-centered nutrition education as part of interdisciplinary care.   Monitor and evaluate progress toward nutrition goal with team.  Mickle PlumbEdna Airen Stiehl, M.Ed, RD, LDN, CDE 01/28/2017 10:50 AM

## 2017-02-01 ENCOUNTER — Encounter (HOSPITAL_COMMUNITY)
Admission: RE | Admit: 2017-02-01 | Discharge: 2017-02-01 | Disposition: A | Discharge status: Home / Self Care | Payer: BC Managed Care – PPO | Source: Ambulatory Visit | Attending: Cardiology | Admitting: Cardiology

## 2017-02-01 DIAGNOSIS — Z955 Presence of coronary angioplasty implant and graft: Secondary | ICD-10-CM

## 2017-02-01 DIAGNOSIS — I214 Non-ST elevation (NSTEMI) myocardial infarction: Secondary | ICD-10-CM

## 2017-02-01 DIAGNOSIS — Z48812 Encounter for surgical aftercare following surgery on the circulatory system: Secondary | ICD-10-CM | POA: Diagnosis not present

## 2017-02-01 LAB — GLUCOSE, CAPILLARY
GLUCOSE-CAPILLARY: 99 mg/dL (ref 65–99)
Glucose-Capillary: 88 mg/dL (ref 65–99)

## 2017-02-01 NOTE — Progress Notes (Signed)
Daily Session Note  Patient Details  Name: Kristine Newman MRN: [id number] Date of Birth: 03/22/1975 Referring Provider:     CARDIAC REHAB PHASE II ORIENTATION from 01/28/2017 in [facility]  Referring Provider  Patwardhan, Manish MD      Encounter Date: 02/01/2017  Check In: Session Check In - 02/01/17 1533      Check-In   Staff Present  Olinty Richards, MS, ACSM CEP, Exercise Physiologist;Maria Whitaker, RN, BSN;Portia Payne, RN, BSN;Amber Fair, MS, ACSM RCEP, Exercise Physiologist    Supervising physician immediately available to respond to emergencies  [facility] immediately available    Physician(s)  Dr. Ezenduka    Medication changes reported      No    Fall or balance concerns reported     No    Tobacco Cessation  No Change    Warm-up and Cool-down  Performed as group-led instruction    Resistance Training Performed  Yes    VAD Patient?  No      Pain Assessment   Currently in Pain?  No/denies    Multiple Pain Sites  No       Capillary Blood Glucose: Results for orders placed or performed during the hospital encounter of 02/01/17 (from the past 24 hour(s))  Glucose, capillary     Status: None   Collection Time: 02/01/17  2:53 PM  Result Value Ref Range   Glucose-Capillary 88 65 - 99 mg/dL  Glucose, capillary     Status: None   Collection Time: 02/01/17  3:35 PM  Result Value Ref Range   Glucose-Capillary 99 65 - 99 mg/dL      Social History   Tobacco Use  Smoking Status Never Smoker  Smokeless Tobacco Never Used    Goals Met:  Exercise tolerated well  Goals Unmet:  Not Applicable  Comments: Kristine Newman started cardiac rehab today.  Pt tolerated light exercise without difficulty. VSS, telemetry-sinus rhtyhm, asymptomatic.  Medication list reconciled. Pt denies barriers to medicaiton compliance.  PSYCHOSOCIAL ASSESSMENT:  PHQ-1. Pt exhibits positive coping skills, hopeful outlook with supportive family. Kristine Newman admits to feeling  Anxious about having another heart event. Emotional support given. Kristine Newman saw her therapist this morning and I took Kristine Newman to see Kristine Newman hospital chaplain after class today.    Pt enjoys crafting.   Pt oriented to exercise equipment and routine.    Understanding verbalized. Kristine Newman had no complaints or symptoms today.Will continue to monitor the patient throughout  the program. Kristine Newman had ate an egg white sandwich this morning and some fruit this afternoon. I gave Kristine Newman some graham crackers, peanut butter and lemonade prior to exercising today and asked Kristine Newman to eat lunch prior to coming to exercise in the afternoon. Patient states understanding.  Maria Whitaker, RN,BSN 02/01/2017 4:42 PM    Dr. Traci Turner is Medical Director for Cardiac Rehab at [facility]. 

## 2017-02-03 ENCOUNTER — Encounter (HOSPITAL_COMMUNITY)
Admission: RE | Admit: 2017-02-03 | Discharge: 2017-02-03 | Disposition: A | Discharge status: Home / Self Care | Payer: BC Managed Care – PPO | Source: Ambulatory Visit | Attending: Cardiology | Admitting: Cardiology

## 2017-02-03 DIAGNOSIS — Z48812 Encounter for surgical aftercare following surgery on the circulatory system: Secondary | ICD-10-CM | POA: Diagnosis not present

## 2017-02-03 DIAGNOSIS — Z955 Presence of coronary angioplasty implant and graft: Secondary | ICD-10-CM

## 2017-02-03 DIAGNOSIS — I214 Non-ST elevation (NSTEMI) myocardial infarction: Secondary | ICD-10-CM

## 2017-02-03 LAB — GLUCOSE, CAPILLARY: GLUCOSE-CAPILLARY: 98 mg/dL (ref 65–99)

## 2017-02-03 NOTE — Progress Notes (Signed)
Iynanna report having chest discomfort rated a 1-2/10 while on the scifit bike  at cardiac rehab. Cherylann Parryanna carried on a conversation and exercised without complaints on her second day of exercise. Telemetry rhythm Sinus 66. Blood pressure 128/80. Oxygen saturation 100% on room air. By the time Cherylann Parryanna got on the treadmill her discomfort went away. Innocence did not have any further reports of chest discomfort for the remainder of exercise. Dr Damian LeavellPatwardhan's office was called and notified. I spoke with Victorino DikeJennifer. Will fax exercise flow sheets to Dr. Rosemary HolmsPatwardhan office for review with today's ECG tracing. I left a message with Wilford GristMarion Hazy NP's office to schedule an appointment for the patient to discuss her ongoing issue's with anxiety.QUALITY OF LIFE SCORE REVIEW  Pt completed Quality of Life survey as a participant in Cardiac Rehab. Scores 21.0 or below are considered low. Pt score very low in health and functioning Overall 20.06, Health and Function 16.89, socioeconomic 22.21, physiological and spiritual 22.21, family 23.63. Patient quality of life slightly altered by physical constraints which limits ability to perform as prior to recent cardiac illness. Cherylann Parryanna reports that she still has intermittent chest discomfort at times and shortness of breath .  Offered emotional support and reassurance.  Will continue to monitor and intervene as necessary.  Cherylann Parryanna saw the hospital chaplain Theda BelfastBob Hamilton and said the visit with him was helpful. Cherylann Parryanna continues to see her counselor on a regular basis. Will fax quality of life questionnaire to Wilford GristMarion Hazy NP's office for review.Gladstone LighterMaria Whitaker, RN,BSN 02/03/2017 4:59 PM

## 2017-02-05 ENCOUNTER — Encounter (HOSPITAL_COMMUNITY)
Admission: RE | Admit: 2017-02-05 | Discharge: 2017-02-05 | Disposition: A | Discharge status: Home / Self Care | Payer: BC Managed Care – PPO | Source: Ambulatory Visit

## 2017-02-08 ENCOUNTER — Encounter (HOSPITAL_COMMUNITY)
Admission: RE | Admit: 2017-02-08 | Discharge: 2017-02-08 | Disposition: A | Discharge status: Home / Self Care | Payer: BC Managed Care – PPO | Source: Ambulatory Visit | Attending: Cardiology | Admitting: Cardiology

## 2017-02-08 DIAGNOSIS — I214 Non-ST elevation (NSTEMI) myocardial infarction: Secondary | ICD-10-CM

## 2017-02-08 DIAGNOSIS — Z48812 Encounter for surgical aftercare following surgery on the circulatory system: Secondary | ICD-10-CM | POA: Diagnosis not present

## 2017-02-08 DIAGNOSIS — Z955 Presence of coronary angioplasty implant and graft: Secondary | ICD-10-CM

## 2017-02-08 NOTE — Progress Notes (Signed)
Reviewed home exercise guidelines with patient including endpoints, temperature precautions, target heart rate and rate of perceived exertion. Pt is walking 30 minutes, 2 days/week as her mode of home exercise. Pt voices understanding of instructions given. Artist Paislinty M Asuncion Tapscott, MS, ACSM CEP

## 2017-02-10 ENCOUNTER — Encounter (HOSPITAL_COMMUNITY)
Admission: RE | Admit: 2017-02-10 | Discharge: 2017-02-10 | Disposition: A | Discharge status: Home / Self Care | Payer: BC Managed Care – PPO | Source: Ambulatory Visit | Attending: Cardiology | Admitting: Cardiology

## 2017-02-10 DIAGNOSIS — I214 Non-ST elevation (NSTEMI) myocardial infarction: Secondary | ICD-10-CM

## 2017-02-10 DIAGNOSIS — Z48812 Encounter for surgical aftercare following surgery on the circulatory system: Secondary | ICD-10-CM | POA: Diagnosis not present

## 2017-02-10 DIAGNOSIS — Z955 Presence of coronary angioplasty implant and graft: Secondary | ICD-10-CM

## 2017-02-10 NOTE — Progress Notes (Signed)
Cardiac Individual Treatment Plan  Patient Details  Name: Kristine Newman MRN: [id number] Date of Birth: 11/14/1975 Referring Provider:     CARDIAC REHAB PHASE II ORIENTATION from 01/28/2017 in [facility] CARDIAC REHAB  Referring Provider  Truett Mainland MD      Initial Encounter Date:    CARDIAC REHAB PHASE II ORIENTATION from 01/28/2017 in [facility] CARDIAC REHAB  Date  01/28/17  Referring Provider  Truett Mainland MD      Visit Diagnosis: NSTEMI (non-ST elevated myocardial infarction) [facility])  Status post coronary artery stent placement  Patient's Home Medications on Admission:  Current Outpatient Medications:  .  sertraline (ZOLOFT) 50 MG tablet, Take 50 mg by mouth daily., Disp: , Rfl:  .  acetaminophen (TYLENOL) 500 MG tablet, Take 500 mg every 6 (six) hours as needed by mouth for mild pain., Disp: , Rfl:  .  aspirin (ASPIRIN CHILDRENS) 81 MG chewable tablet, Chew 1 tablet (81 mg total) by mouth daily., Disp: 90 tablet, Rfl: 3 .  atorvastatin (LIPITOR) 80 MG tablet, Take 1 tablet (80 mg total) by mouth daily at 6 PM., Disp: 90 tablet, Rfl: 3 .  clopidogrel (PLAVIX) 75 MG tablet, Take 75 mg by mouth daily., Disp: , Rfl:  .  fluticasone (FLONASE) 50 MCG/ACT nasal spray, Place 2 sprays daily as needed into both nostrils for allergies. , Disp: , Rfl:  .  guaiFENesin (MUCINEX) 600 MG 12 hr tablet, Take 600 mg by mouth 2 (two) times daily as needed for to loosen phlegm., Disp: , Rfl:  .  ipratropium (ATROVENT) 0.06 % nasal spray, Place 2 sprays into the nose daily as needed for rhinitis. , Disp: , Rfl: 5 .  lisinopril (PRINIVIL,ZESTRIL) 10 MG tablet, Take 1 tablet (10 mg total) by mouth daily. (Patient taking differently: Take 20 mg by mouth daily. ), Disp: 90 tablet, Rfl: 3 .  metoprolol tartrate (LOPRESSOR) 50 MG tablet, Take 1 tablet (50 mg total) by mouth 2 (two) times daily. (Patient taking differently: Take 25 mg by mouth 2 (two) times daily.  ), Disp: 60 tablet, Rfl: 3 .  nitroGLYCERIN (NITROSTAT) 0.4 MG SL tablet, Place 1 tablet (0.4 mg total) under the tongue every 5 (five) minutes as needed for chest pain., Disp: 30 tablet, Rfl: 3 .  pantoprazole (PROTONIX) 40 MG tablet, Take 40 mg daily by mouth., Disp: , Rfl:  .  PROAIR HFA 108 (90 Base) MCG/ACT inhaler, Take 2 puffs by mouth every 4 (four) hours as needed for wheezing or shortness of breath., Disp: , Rfl: 0 .  Vitamin D, Ergocalciferol, (DRISDOL) 50000 units CAPS capsule, Take 50,000 Units by mouth every Tuesday. , Disp: , Rfl: 0  Past Medical History: Past Medical History:  Diagnosis Date  . Anxiety   . High cholesterol   . Hypertension   . NSTEMI (non-ST elevated myocardial infarction) (HCC) 11/15/2016  . Pre-diabetes     Tobacco Use: Social History   Tobacco Use  Smoking Status Never Smoker  Smokeless Tobacco Never Used    Labs: Recent Review Advice worker    Labs for ITP Cardiac and Pulmonary Rehab Latest Ref Rng & Units 11/16/2016   Cholestrol 0 - 200 mg/dL 829(F)   LDLCALC 0 - 99 mg/dL 621(H)   HDL >08 mg/dL 45   Trlycerides <657 mg/dL 846(N)   Hemoglobin G2X 4.8 - 5.6 % 6.3(H)      Capillary Blood Glucose: Lab Results  Component Value Date   GLUCAP 98 02/03/2017  GLUCAP 99 02/01/2017   GLUCAP 88 02/01/2017   GLUCAP 85 11/20/2016   GLUCAP 78 11/19/2016     Exercise Target Goals:    Exercise Program Goal: Individual exercise prescription set using results from initial 6 min walk test and THRR while considering  patient's activity barriers and safety.   Exercise Prescription Goal: Initial exercise prescription builds to 30-45 minutes a day of aerobic activity, 2-3 days per week.  Home exercise guidelines will be given to patient during program as part of exercise prescription that the participant will acknowledge.  Activity Barriers & Risk Stratification: Activity Barriers & Cardiac Risk Stratification - 01/28/17 1208      Activity  Barriers & Cardiac Risk Stratification   Activity Barriers  Deconditioning;Muscular Weakness;Shortness of Breath;Chest Pain/Angina    Cardiac Risk Stratification  High       6 Minute Walk: 6 Minute Walk    Row Name 01/28/17 0928 01/28/17 0947 01/28/17 1208     6 Minute Walk   Phase  Initial  -  -   Distance  1210 feet  -  -   Walk Time  6 minutes  -  -   # of Rest Breaks  0  -  -   MPH  -  2.3  -   METS  -  3.6  -   RPE  11  -  -   Perceived Dyspnea   1  -  -   VO2 Peak  -  12.45  -   Symptoms  Yes (comment)  -  -   Comments  SOB   -  -   Resting HR  -  55 bpm  -   Resting BP  112/64  112/64  -   Resting Oxygen Saturation   98 %  -  -   Exercise Oxygen Saturation  during 6 min walk  99 %  -  -   Max Ex. HR  -  83 bpm  -   Max Ex. BP  118/62  -  -   2 Minute Post BP  -  -  110/68      Oxygen Initial Assessment:   Oxygen Re-Evaluation:   Oxygen Discharge (Final Oxygen Re-Evaluation):   Initial Exercise Prescription: Initial Exercise Prescription - 01/28/17 1200      Date of Initial Exercise RX and Referring Provider   Date  01/28/17    Referring Provider  Truett Mainland MD      Treadmill   MPH  2.3    Grade  0    Minutes  10    METs  2.76      Recumbant Bike   Level  1.5 upright scifit    Minutes  10    METs  2      NuStep   Level  2    SPM  70    Minutes  10    METs  2      Prescription Details   Frequency (times per week)  3    Duration  Progress to 30 minutes of continuous aerobic without signs/symptoms of physical distress      Intensity   THRR 40-80% of Max Heartrate  71-142    Ratings of Perceived Exertion  11-15    Perceived Dyspnea  0-4      Progression   Progression  Continue to progress workloads to maintain intensity without signs/symptoms of physical distress.      Resistance Training  Training Prescription  Yes    Weight  2lbs    Reps  10-15       Perform Capillary Blood Glucose checks as needed.  Exercise  Prescription Changes: Exercise Prescription Changes    Row Name 02/01/17 1455             Response to Exercise   Blood Pressure (Admit)  132/84       Blood Pressure (Exercise)  134/72       Blood Pressure (Exit)  100/68       Heart Rate (Admit)  74 bpm       Heart Rate (Exercise)  94 bpm       Heart Rate (Exit)  63 bpm       Rating of Perceived Exertion (Exercise)  12       Symptoms  none       Duration  Progress to 30 minutes of  aerobic without signs/symptoms of physical distress       Intensity  THRR unchanged         Progression   Progression  Continue to progress workloads to maintain intensity without signs/symptoms of physical distress.       Average METs  2.5         Resistance Training   Training Prescription  Yes       Weight  2lbs       Reps  10-15       Time  10 Minutes         Interval Training   Interval Training  No         Treadmill   MPH  2.3       Grade  0       Minutes  10       METs  2.76         Recumbant Bike   Level  - upright scifit         NuStep   Level  2       SPM  70       Minutes  10       METs  2.2          Exercise Comments: Exercise Comments    Row Name 02/01/17 1455 02/03/17 1600         Exercise Comments  Off to a good start with exercise.  Reviewed METs and goals with patient.         Exercise Goals and Review: Exercise Goals    Row Name 01/28/17 0929             Exercise Goals   Increase Physical Activity  Yes       Intervention  Provide advice, education, support and counseling about physical activity/exercise needs.;Develop an individualized exercise prescription for aerobic and resistive training based on initial evaluation findings, risk stratification, comorbidities and participant's personal goals.       Expected Outcomes  Achievement of increased cardiorespiratory fitness and enhanced flexibility, muscular endurance and strength shown through measurements of functional capacity and personal statement of  participant.       Increase Strength and Stamina  Yes       Intervention  Provide advice, education, support and counseling about physical activity/exercise needs.;Develop an individualized exercise prescription for aerobic and resistive training based on initial evaluation findings, risk stratification, comorbidities and participant's personal goals.       Expected Outcomes  Achievement of increased cardiorespiratory fitness and enhanced flexibility, muscular  endurance and strength shown through measurements of functional capacity and personal statement of participant.       Able to understand and use rate of perceived exertion (RPE) scale  Yes       Intervention  Provide education and explanation on how to use RPE scale       Expected Outcomes  Short Term: Able to use RPE daily in rehab to express subjective intensity level;Long Term:  Able to use RPE to guide intensity level when exercising independently       Knowledge and understanding of Target Heart Rate Range (THRR)  Yes       Intervention  Provide education and explanation of THRR including how the numbers were predicted and where they are located for reference       Expected Outcomes  Short Term: Able to state/look up THRR;Short Term: Able to use daily as guideline for intensity in rehab;Long Term: Able to use THRR to govern intensity when exercising independently       Able to check pulse independently  Yes       Intervention  Provide education and demonstration on how to check pulse in carotid and radial arteries.;Review the importance of being able to check your own pulse for safety during independent exercise       Expected Outcomes  Short Term: Able to explain why pulse checking is important during independent exercise;Long Term: Able to check pulse independently and accurately       Understanding of Exercise Prescription  Yes       Intervention  Provide education, explanation, and written materials on patient's individual exercise  prescription       Expected Outcomes  Short Term: Able to explain program exercise prescription;Long Term: Able to explain home exercise prescription to exercise independently          Exercise Goals Re-Evaluation : Exercise Goals Re-Evaluation    Row Name 02/03/17 1710 02/08/17 1530 02/10/17 1520         Exercise Goal Re-Evaluation   Exercise Goals Review  Increase Physical Activity;Understanding of Exercise Prescription  Understanding of Exercise Prescription;Knowledge and understanding of Target Heart Rate Range (THRR);Able to understand and use rate of perceived exertion (RPE) scale  Increase Physical Activity;Increase Strength and Stamina     Comments  Patient is walking 2 days/week in addition to exercise at [facility] and plans to join a gym  Reviewed home exercise guidelines with patient including THRR, RPE scale and endpoints for exercise.  Patient would like to be able to walk up the 2 flights of stairs to get to her job. Discussed taking it 1 flight then the elevator and progressing from there. Added walking the stairs 5 minutes in conjunction with walking TM 5 mins, also increased from 2. with 0% incline on TM to 2.3 with 1% incline to help build stamina with inclines.     Expected Outcomes  Patient will continue to exercise at least 5 days/week to help achieve health and fitness goals.  Continue 30 minutes exercise, 5 days/week.  Patient will be able to comfortably walk up 1 flight of stairs at work then progress to walking 2 flights of stairs at work.         Discharge Exercise Prescription (Final Exercise Prescription Changes): Exercise Prescription Changes - 02/01/17 1455      Response to Exercise   Blood Pressure (Admit)  132/84    Blood Pressure (Exercise)  134/72    Blood Pressure (Exit)  100/68  Heart Rate (Admit)  74 bpm    Heart Rate (Exercise)  94 bpm    Heart Rate (Exit)  63 bpm    Rating of Perceived Exertion (Exercise)  12    Symptoms  none    Duration  Progress  to 30 minutes of  aerobic without signs/symptoms of physical distress    Intensity  THRR unchanged      Progression   Progression  Continue to progress workloads to maintain intensity without signs/symptoms of physical distress.    Average METs  2.5      Resistance Training   Training Prescription  Yes    Weight  2lbs    Reps  10-15    Time  10 Minutes      Interval Training   Interval Training  No      Treadmill   MPH  2.3    Grade  0    Minutes  10    METs  2.76      Recumbant Bike   Level  -- upright scifit      NuStep   Level  2    SPM  70    Minutes  10    METs  2.2       Nutrition:  Target Goals: Understanding of nutrition guidelines, daily intake of sodium 1500mg , cholesterol 200mg , calories 30% from fat and 7% or less from saturated fats, daily to have 5 or more servings of fruits and vegetables.  Biometrics: Pre Biometrics - 01/28/17 0948      Pre Biometrics   Height  5\' 3"  (1.6 m)    Weight  192 lb 3.9 oz (87.2 kg)    Waist Circumference  41.5 inches    Hip Circumference  43.5 inches    Waist to Hip Ratio  0.95 %    BMI (Calculated)  34.06    Triceps Skinfold  33 mm    % Body Fat  43.9 %    Grip Strength  33 kg    Flexibility  14 in    Single Leg Stand  20.28 seconds        Nutrition Therapy Plan and Nutrition Goals: Nutrition Therapy & Goals - 01/28/17 1054      Nutrition Therapy   Diet  Heart Healthy      Personal Nutrition Goals   Nutrition Goal  Pt to identify and limit food sources of saturated fat, trans fat, and sodium    Personal Goal #2  Pt to identify food quantities necessary to achieve weight loss of 6-24 lb (2.7-10.9 kg) at graduation from cardiac rehab.        Intervention Plan   Intervention  Prescribe, educate and counsel regarding individualized specific dietary modifications aiming towards targeted core components such as weight, hypertension, lipid management, diabetes, heart failure and other comorbidities.    Expected  Outcomes  Short Term Goal: Understand basic principles of dietary content, such as calories, fat, sodium, cholesterol and nutrients.;Long Term Goal: Adherence to prescribed nutrition plan.       Nutrition Assessments: Nutrition Assessments - 01/28/17 1055      MEDFICTS Scores   Pre Score  33       Nutrition Goals Re-Evaluation:   Nutrition Goals Re-Evaluation:   Nutrition Goals Discharge (Final Nutrition Goals Re-Evaluation):   Psychosocial: Target Goals: Acknowledge presence or absence of significant depression and/or stress, maximize coping skills, provide positive support system. Participant is able to verbalize types and ability to use techniques and  skills needed for reducing stress and depression.  Initial Review & Psychosocial Screening: Initial Psych Review & Screening - 01/28/17 1259      Initial Review   Current issues with  Current Anxiety/Panic;Current Stress Concerns;Current Sleep Concerns    Source of Stress Concerns  Unable to participate in former interests or hobbies;Unable to perform yard/household activities;Chronic Illness    Comments  Pt reports feeling anxious regarding having another heart event      Family Dynamics   Good Support System?  Yes      Barriers   Psychosocial barriers to participate in program  The patient should benefit from training in stress management and relaxation.      Screening Interventions   Interventions  To provide support and resources with identified psychosocial needs;Encouraged to exercise       Quality of Life Scores: Quality of Life - 01/28/17 0949      Quality of Life Scores   Health/Function Pre  16.89 %    Socioeconomic Pre  22.21 %    Psych/Spiritual Pre  22.21 %    Family Pre  23.63 %    GLOBAL Pre  20.06 %      Scores of 19 and below usually indicate a poorer quality of life in these areas.  A difference of  2-3 points is a clinically meaningful difference.  A difference of 2-3 points in the total score  of the Quality of Life Index has been associated with significant improvement in overall quality of life, self-image, physical symptoms, and general health in studies assessing change in quality of life.  PHQ-9: Recent Review Flowsheet Data    Depression screen [facility] 2/9 02/01/2017   Decreased Interest 0   Down, Depressed, Hopeless 1    PHQ - 2 Score 1     Interpretation of Total Score  Total Score Depression Severity:  1-4 = Minimal depression, 5-9 = Mild depression, 10-14 = Moderate depression, 15-19 = Moderately severe depression, 20-27 = Severe depression   Psychosocial Evaluation and Intervention: Psychosocial Evaluation - 01/28/17 1302      Psychosocial Evaluation & Interventions   Interventions  Therapist referral;Physician referral;Relaxation education;Stress management education;Encouraged to exercise with the program and follow exercise prescription    Comments  Pt to restablish with Psychologist that she has seen in the past.  Pt to ask primary MD for anxiety medication that was discussed at the last follow up.  Appointment made with chaplain at spiritual care depart for Monday 1/4 after exercise.    Expected Outcomes  Pt will seek help when needed and establish positive and healthy coping skills to contribute in a meaningful way to her community.    Continue Psychosocial Services   Follow up required by staff       Psychosocial Re-Evaluation: Psychosocial Re-Evaluation    Row Name 02/10/17 1703             Psychosocial Re-Evaluation   Current issues with  Current Anxiety/Panic       Comments  Wyoma will continue to participate in cardiac rehab and meet with her therapist. Mahalia has been presribed zoloft for anxiety       Expected Outcomes  Zyan will have less stress and anxiety upon completion of cardiac rehab.       Interventions  Stress management education;Physician referral;Therapist referral;Encouraged to attend Cardiac Rehabilitation for the exercise Deeanne  meets with her therapist on a regular basis and saw her primary provider last week for anxiety  Continue Psychosocial Services   Follow up required by staff          Psychosocial Discharge (Final Psychosocial Re-Evaluation): Psychosocial Re-Evaluation - 02/10/17 1703      Psychosocial Re-Evaluation   Current issues with  Current Anxiety/Panic    Comments  Kimmy will continue to participate in cardiac rehab and meet with her therapist. Rozanne has been presribed zoloft for anxiety    Expected Outcomes  Gaynor will have less stress and anxiety upon completion of cardiac rehab.    Interventions  Stress management education;Physician referral;Therapist referral;Encouraged to attend Cardiac Rehabilitation for the exercise Lakyra meets with her therapist on a regular basis and saw her primary provider last week for anxiety    Continue Psychosocial Services   Follow up required by staff       Vocational Rehabilitation: Provide vocational rehab assistance to qualifying candidates.   Vocational Rehab Evaluation & Intervention: Vocational Rehab - 01/28/17 1306      Initial Vocational Rehab Evaluation & Intervention   Assessment shows need for Vocational Rehabilitation  No Pt has returned to work as a Lexicographer at [employer]       Education: Education Goals: Education classes will be provided on a weekly basis, covering required topics. Participant will state understanding/return demonstration of topics presented.  Learning Barriers/Preferences: Learning Barriers/Preferences - 01/28/17 1610      Learning Barriers/Preferences   Learning Barriers  Sight    Learning Preferences  Written Material       Education Topics: Count Your Pulse:  -Group instruction provided by verbal instruction, demonstration, patient participation and written materials to support subject.  Instructors address importance of being able to find your pulse and how to count your pulse when at home  without a heart monitor.  Patients get hands on experience counting their pulse with staff help and individually.   Heart Attack, Angina, and Risk Factor Modification:  -Group instruction provided by verbal instruction, video, and written materials to support subject.  Instructors address signs and symptoms of angina and heart attacks.    Also discuss risk factors for heart disease and how to make changes to improve heart health risk factors.   Functional Fitness:  -Group instruction provided by verbal instruction, demonstration, patient participation, and written materials to support subject.  Instructors address safety measures for doing things around the house.  Discuss how to get up and down off the floor, how to pick things up properly, how to safely get out of a chair without assistance, and balance training.   Meditation and Mindfulness:  -Group instruction provided by verbal instruction, patient participation, and written materials to support subject.  Instructor addresses importance of mindfulness and meditation practice to help reduce stress and improve awareness.  Instructor also leads participants through a meditation exercise.    Stretching for Flexibility and Mobility:  -Group instruction provided by verbal instruction, patient participation, and written materials to support subject.  Instructors lead participants through series of stretches that are designed to increase flexibility thus improving mobility.  These stretches are additional exercise for major muscle groups that are typically performed during regular warm up and cool down.   Hands Only CPR:  -Group verbal, video, and participation provides a basic overview of AHA guidelines for community CPR. Role-play of emergencies allow participants the opportunity to practice calling for help and chest compression technique with discussion of AED use.   Hypertension: -Group verbal and written instruction that provides a basic  overview of  hypertension including the most recent diagnostic guidelines, risk factor reduction with self-care instructions and medication management.    Nutrition I class: Heart Healthy Eating:  -Group instruction provided by PowerPoint slides, verbal discussion, and written materials to support subject matter. The instructor gives an explanation and review of the Therapeutic Lifestyle Changes diet recommendations, which includes a discussion on lipid goals, dietary fat, sodium, fiber, plant stanol/sterol esters, sugar, and the components of a well-balanced, healthy diet.   CARDIAC REHAB [facility]  Date  01/28/17  Educator  RD  Instruction Review Code  Not applicable      Nutrition II class: Lifestyle Skills:  -Group instruction provided by PowerPoint slides, verbal discussion, and written materials to support subject matter. The instructor gives an explanation and review of label reading, grocery shopping for heart health, heart healthy recipe modifications, and ways to make healthier choices when eating out.   [facility]  Date  01/28/17  Educator  RD  Instruction Review Code  Not applicable      Diabetes Question & Answer:  -Group instruction provided by PowerPoint slides, verbal discussion, and written materials to support subject matter. The instructor gives an explanation and review of diabetes co-morbidities, pre- and post-prandial blood glucose goals, pre-exercise blood glucose goals, signs, symptoms, and treatment of hypoglycemia and hyperglycemia, and foot care basics.   CARDIAC REHAB PHASE II EXERCISE [facility]  Date  02/05/17  Educator  RD  Instruction Review Code  2- meets goals/outcomes      Diabetes Blitz:  -Group instruction provided by PowerPoint slides, verbal  discussion, and written materials to support subject matter. The instructor gives an explanation and review of the physiology behind type 1 and type 2 diabetes, diabetes medications and rational behind using different medications, pre- and post-prandial blood glucose recommendations and Hemoglobin A1c goals, diabetes diet, and exercise including blood glucose guidelines for exercising safely.    Portion Distortion:  -Group instruction provided by PowerPoint slides, verbal discussion, written materials, and food models to support subject matter. The instructor gives an explanation of serving size versus portion size, changes in portions sizes over the last 20 years, and what consists of a serving from each food group.   Stress Management:  -Group instruction provided by verbal instruction, video, and written materials to support subject matter.  Instructors review role of stress in heart disease and how to cope with stress positively.     Exercising on Your Own:  -Group instruction provided by verbal instruction, power point, and written materials to support subject.  Instructors discuss benefits of exercise, components of exercise, frequency and intensity of exercise, and end points for exercise.  Also discuss use of nitroglycerin and activating EMS.  Review options of places to exercise outside of rehab.  Review guidelines for sex with heart disease.   CARDIAC REHAB PHASE [facility]  Date  02/03/17  Educator  EP  Instruction Review Code  2- meets goals/outcomes      Cardiac Drugs I:  -Group instruction provided by verbal instruction and written materials to support subject.  Instructor reviews cardiac drug classes: antiplatelets, anticoagulants, beta blockers, and statins.  Instructor discusses reasons, side effects, and lifestyle considerations for each drug class.   Cardiac Drugs II:  -Group instruction provided by verbal  instruction and written materials to support subject.  Instructor reviews cardiac drug classes: angiotensin converting enzyme inhibitors (ACE-I), angiotensin II receptor blockers (ARBs), nitrates, and calcium channel blockers.  Instructor discusses reasons, side effects, and lifestyle considerations for each drug class.   Anatomy and Physiology of the Circulatory System:  Group verbal and written instruction and models provide basic cardiac anatomy and physiology, with the coronary electrical and arterial systems. Review of: AMI, Angina, Valve disease, Heart Failure, Peripheral Artery Disease, Cardiac Arrhythmia, Pacemakers, and the ICD.   Other Education:  -Group or individual verbal, written, or video instructions that support the educational goals of the cardiac rehab program.   Knowledge Questionnaire Score: Knowledge Questionnaire Score - 01/28/17 0849      Knowledge Questionnaire Score   Pre Score  20/24       Core Components/Risk Factors/Patient Goals at Admission: Personal Goals and Risk Factors at Admission - 01/28/17 1211      Core Components/Risk Factors/Patient Goals on Admission    Weight Management  Yes;Obesity;Weight Maintenance;Weight Loss    Intervention  Weight Management: Develop a combined nutrition and exercise program designed to reach desired caloric intake, while maintaining appropriate intake of nutrient and fiber, sodium and fats, and appropriate energy expenditure required for the weight goal.;Weight Management: Provide education and appropriate resources to help participant work on and attain dietary goals.;Weight Management/Obesity: Establish reasonable short term and long term weight goals.;Obesity: Provide education and appropriate resources to help participant work on and attain dietary goals.    Admit Weight  192 lb 3.9 oz (87.2 kg)    Goal Weight: Short Term  185 lb (83.9 kg)    Goal Weight: Long Term  155 lb (70.3 kg)    Expected Outcomes  Short Term:  Continue to assess and modify interventions until short term weight is achieved;Long Term: Adherence to nutrition and physical activity/exercise program aimed toward attainment of established weight goal;Weight Maintenance: Understanding of the daily nutrition guidelines, which includes 25-35% calories from fat, 7% or less cal from saturated fats, less than 200mg  cholesterol, less than 1.5gm of sodium, & 5 or more servings of fruits and vegetables daily;Weight Loss: Understanding of general recommendations for a balanced deficit meal plan, which promotes 1-2 lb weight loss per week and includes a negative energy balance of [phone] kcal/d;Understanding recommendations for meals to include 15-35% energy as protein, 25-35% energy from fat, 35-60% energy from carbohydrates, less than 200mg  of dietary cholesterol, 20-35 gm of total fiber daily;Understanding of distribution of calorie intake throughout the day with the consumption of 4-5 meals/snacks    Hypertension  Yes    Intervention  Provide education on lifestyle modifcations including regular physical activity/exercise, weight management, moderate sodium restriction and increased consumption of fresh fruit, vegetables, and low fat dairy, alcohol moderation, and smoking cessation.;Monitor prescription use compliance.    Expected Outcomes  Short Term: Continued assessment and intervention until BP is < 140/49mm HG in hypertensive participants. < 130/24mm HG in hypertensive participants with diabetes, heart failure or chronic kidney disease.;Long Term: Maintenance of blood pressure at goal levels.    Lipids  Yes    Intervention  Provide education and support for participant on nutrition & aerobic/resistive exercise along with prescribed medications to achieve LDL 70mg , HDL >40mg .    Expected Outcomes  Short Term: Participant states understanding of desired cholesterol values and is compliant with medications prescribed. Participant is following exercise  prescription and nutrition guidelines.;Long Term: Cholesterol controlled with medications as prescribed, with individualized exercise RX and  with personalized nutrition plan. Value goals: LDL < 70mg , HDL > 40 mg.    Stress  Yes    Intervention  Offer individual and/or small group education and counseling on adjustment to heart disease, stress management and health-related lifestyle change. Teach and support self-help strategies.;Refer participants experiencing significant psychosocial distress to appropriate mental health specialists for further evaluation and treatment. When possible, include family members and significant others in education/counseling sessions.    Expected Outcomes  Short Term: Participant demonstrates changes in health-related behavior, relaxation and other stress management skills, ability to obtain effective social support, and compliance with psychotropic medications if prescribed.;Long Term: Emotional wellbeing is indicated by absence of clinically significant psychosocial distress or social isolation.    Personal Goal Other  Yes    Personal Goal  Decrease fear avoidance and anxiety about recent cardiac event. Build confidence and learn about exercise/activity limitations    Intervention  Provide counseling on stress management and coping mechanisms. Further discuss activity restrictions and exercise programming to build on confidence with activity.     Expected Outcomes  Pt will have increase confidence and motivation for physical actvity/exercise and decrease stress and worries about cardiac event. More importantly, pt will have a better understanding of activity limitations and reduce modifiable risk factors.        Core Components/Risk Factors/Patient Goals Review:  Goals and Risk Factor Review    Row Name 02/10/17 1659             Core Components/Risk Factors/Patient Goals Review   Personal Goals Review  Weight Management/Obesity;Lipids;Hypertension;Stress        Review  Laisha'a vital signs have bee stable. Laketha is off to a good start ton exercise       Expected Outcomes  Felicia will continue to take her medications for HTN, Hyperlipidemia and follow a heart healthy diabetic diet.          Core Components/Risk Factors/Patient Goals at Discharge (Final Review):  Goals and Risk Factor Review - 02/10/17 1659      Core Components/Risk Factors/Patient Goals Review   Personal Goals Review  Weight Management/Obesity;Lipids;Hypertension;Stress    Review  Krystalle'a vital signs have bee stable. Neva is off to a good start ton exercise    Expected Outcomes  Jazariah will continue to take her medications for HTN, Hyperlipidemia and follow a heart healthy diabetic diet.       ITP Comments: ITP Comments    Row Name 01/28/17 0926 02/10/17 1655         ITP Comments  Dr. Armanda Magic, Medical Director  30 day ITP review. Rayvin is off to a good start to exercise. Will continue to monitor for signs of chest pain and give emotional support for anxiety         Comments: See ITP comments.Gladstone Lighter, RN,BSN 02/10/2017 5:09 PM

## 2017-02-12 ENCOUNTER — Encounter (HOSPITAL_COMMUNITY)
Admission: RE | Admit: 2017-02-12 | Discharge: 2017-02-12 | Disposition: A | Discharge status: Home / Self Care | Payer: BC Managed Care – PPO | Source: Ambulatory Visit | Attending: Cardiology | Admitting: Cardiology

## 2017-02-12 DIAGNOSIS — I214 Non-ST elevation (NSTEMI) myocardial infarction: Secondary | ICD-10-CM

## 2017-02-12 DIAGNOSIS — Z48812 Encounter for surgical aftercare following surgery on the circulatory system: Secondary | ICD-10-CM | POA: Diagnosis not present

## 2017-02-12 DIAGNOSIS — Z955 Presence of coronary angioplasty implant and graft: Secondary | ICD-10-CM

## 2017-02-15 ENCOUNTER — Encounter (HOSPITAL_COMMUNITY)
Admission: RE | Admit: 2017-02-15 | Discharge: 2017-02-15 | Disposition: A | Discharge status: Home / Self Care | Payer: BC Managed Care – PPO | Source: Ambulatory Visit | Attending: Cardiology | Admitting: Cardiology

## 2017-02-15 DIAGNOSIS — Z48812 Encounter for surgical aftercare following surgery on the circulatory system: Secondary | ICD-10-CM | POA: Diagnosis not present

## 2017-02-15 DIAGNOSIS — Z955 Presence of coronary angioplasty implant and graft: Secondary | ICD-10-CM

## 2017-02-15 DIAGNOSIS — I214 Non-ST elevation (NSTEMI) myocardial infarction: Secondary | ICD-10-CM

## 2017-02-17 ENCOUNTER — Encounter (HOSPITAL_COMMUNITY)
Admission: RE | Admit: 2017-02-17 | Discharge: 2017-02-17 | Disposition: A | Discharge status: Home / Self Care | Payer: BC Managed Care – PPO | Source: Ambulatory Visit | Attending: Cardiology | Admitting: Cardiology

## 2017-02-17 DIAGNOSIS — I214 Non-ST elevation (NSTEMI) myocardial infarction: Secondary | ICD-10-CM

## 2017-02-17 DIAGNOSIS — Z48812 Encounter for surgical aftercare following surgery on the circulatory system: Secondary | ICD-10-CM | POA: Diagnosis not present

## 2017-02-17 DIAGNOSIS — Z955 Presence of coronary angioplasty implant and graft: Secondary | ICD-10-CM

## 2017-02-19 ENCOUNTER — Encounter (HOSPITAL_COMMUNITY)
Admission: RE | Admit: 2017-02-19 | Discharge: 2017-02-19 | Disposition: A | Discharge status: Home / Self Care | Payer: BC Managed Care – PPO | Source: Ambulatory Visit | Attending: Cardiology | Admitting: Cardiology

## 2017-02-19 DIAGNOSIS — Z48812 Encounter for surgical aftercare following surgery on the circulatory system: Secondary | ICD-10-CM | POA: Diagnosis not present

## 2017-02-19 DIAGNOSIS — I214 Non-ST elevation (NSTEMI) myocardial infarction: Secondary | ICD-10-CM | POA: Insufficient documentation

## 2017-02-19 DIAGNOSIS — Z955 Presence of coronary angioplasty implant and graft: Secondary | ICD-10-CM | POA: Insufficient documentation

## 2017-02-19 DIAGNOSIS — R079 Chest pain, unspecified: Secondary | ICD-10-CM | POA: Insufficient documentation

## 2017-02-19 DIAGNOSIS — R001 Bradycardia, unspecified: Secondary | ICD-10-CM | POA: Diagnosis not present

## 2017-02-22 ENCOUNTER — Encounter (HOSPITAL_COMMUNITY)
Admission: RE | Admit: 2017-02-22 | Discharge: 2017-02-22 | Disposition: A | Discharge status: Home / Self Care | Payer: BC Managed Care – PPO | Source: Ambulatory Visit | Attending: Cardiology | Admitting: Cardiology

## 2017-02-22 DIAGNOSIS — Z955 Presence of coronary angioplasty implant and graft: Secondary | ICD-10-CM

## 2017-02-22 DIAGNOSIS — Z48812 Encounter for surgical aftercare following surgery on the circulatory system: Secondary | ICD-10-CM | POA: Diagnosis not present

## 2017-02-22 DIAGNOSIS — I214 Non-ST elevation (NSTEMI) myocardial infarction: Secondary | ICD-10-CM

## 2017-02-24 ENCOUNTER — Encounter (HOSPITAL_COMMUNITY)
Admission: RE | Admit: 2017-02-24 | Discharge: 2017-02-24 | Disposition: A | Discharge status: Home / Self Care | Payer: BC Managed Care – PPO | Source: Ambulatory Visit | Attending: Cardiology | Admitting: Cardiology

## 2017-02-24 DIAGNOSIS — I214 Non-ST elevation (NSTEMI) myocardial infarction: Secondary | ICD-10-CM

## 2017-02-24 DIAGNOSIS — Z48812 Encounter for surgical aftercare following surgery on the circulatory system: Secondary | ICD-10-CM | POA: Diagnosis not present

## 2017-02-24 DIAGNOSIS — Z955 Presence of coronary angioplasty implant and graft: Secondary | ICD-10-CM

## 2017-02-24 NOTE — Progress Notes (Signed)
Kristine Newman 42 y.o. female DOB: 17-Mar-1975 MRN: [id number]      Nutrition Note  1. Status post coronary artery stent placement   2. NSTEMI (non-ST elevated myocardial infarction) [facility])   Nutrition Note Spoke with pt. Nutrition plan and survey reviewed with pt. Pt is following a Heart Healthy diet. Pt is prediabetic. Prediabetes discussed. Managing pre-diabetes with diet, exercise and wt loss/maintaining a healthy wt reviewed. Pt expressed understanding of the information reviewed. Pt aware of nutrition education classes offered and plans on attending nutrition classes.  Nutrition Diagnosis ? Food-and nutrition-related knowledge deficit related to lack of exposure to information as related to diagnosis of: ? CVD ? Pre-DM ? Obesity related to excessive energy intake as evidenced by a BMI of 34.1  Nutrition Intervention ? Pt's individual nutrition plan and goals reviewed with pt. ? Benefits of adopting Heart Healthy diet discussed when [employer] reviewed.  Nutrition Goal(s):  ? Pt to identify and limit food sources of saturated fat, trans fat, and sodium ? Pt to identify food quantities necessary to achieve weight loss of 6-24 lb (2.7-10.9 kg) at graduation from cardiac rehab.    Plan:  Pt to attend nutrition classes ? Nutrition I ? Nutrition II ? Portion Distortion - met 02/17/17 Will provide client-centered nutrition education as part of interdisciplinary care.   Monitor and evaluate progress toward nutrition goal with team.  Derek Mound, M.Ed, RD, LDN, CDE 02/24/2017 3:28 PM

## 2017-02-26 ENCOUNTER — Encounter (HOSPITAL_COMMUNITY): Payer: BC Managed Care – PPO

## 2017-03-01 ENCOUNTER — Encounter (HOSPITAL_COMMUNITY)
Admission: RE | Admit: 2017-03-01 | Discharge: 2017-03-01 | Disposition: A | Discharge status: Home / Self Care | Payer: BC Managed Care – PPO | Source: Ambulatory Visit | Attending: Cardiology | Admitting: Cardiology

## 2017-03-01 DIAGNOSIS — Z48812 Encounter for surgical aftercare following surgery on the circulatory system: Secondary | ICD-10-CM | POA: Diagnosis not present

## 2017-03-01 DIAGNOSIS — I214 Non-ST elevation (NSTEMI) myocardial infarction: Secondary | ICD-10-CM

## 2017-03-01 DIAGNOSIS — Z955 Presence of coronary angioplasty implant and graft: Secondary | ICD-10-CM

## 2017-03-03 ENCOUNTER — Encounter (HOSPITAL_COMMUNITY)
Admission: RE | Admit: 2017-03-03 | Discharge: 2017-03-03 | Disposition: A | Discharge status: Home / Self Care | Payer: BC Managed Care – PPO | Source: Ambulatory Visit | Attending: Cardiology | Admitting: Cardiology

## 2017-03-03 DIAGNOSIS — Z48812 Encounter for surgical aftercare following surgery on the circulatory system: Secondary | ICD-10-CM | POA: Diagnosis not present

## 2017-03-03 DIAGNOSIS — Z955 Presence of coronary angioplasty implant and graft: Secondary | ICD-10-CM

## 2017-03-03 DIAGNOSIS — I214 Non-ST elevation (NSTEMI) myocardial infarction: Secondary | ICD-10-CM

## 2017-03-05 ENCOUNTER — Encounter (HOSPITAL_COMMUNITY)
Admission: RE | Admit: 2017-03-05 | Discharge: 2017-03-05 | Disposition: A | Discharge status: Home / Self Care | Payer: BC Managed Care – PPO | Source: Ambulatory Visit | Attending: Cardiology | Admitting: Cardiology

## 2017-03-05 DIAGNOSIS — Z48812 Encounter for surgical aftercare following surgery on the circulatory system: Secondary | ICD-10-CM | POA: Diagnosis not present

## 2017-03-08 ENCOUNTER — Encounter (HOSPITAL_COMMUNITY)
Admission: RE | Admit: 2017-03-08 | Discharge: 2017-03-08 | Disposition: A | Discharge status: Home / Self Care | Payer: BC Managed Care – PPO | Source: Ambulatory Visit | Attending: Cardiology | Admitting: Cardiology

## 2017-03-08 DIAGNOSIS — Z48812 Encounter for surgical aftercare following surgery on the circulatory system: Secondary | ICD-10-CM | POA: Diagnosis not present

## 2017-03-08 DIAGNOSIS — I214 Non-ST elevation (NSTEMI) myocardial infarction: Secondary | ICD-10-CM

## 2017-03-08 DIAGNOSIS — Z955 Presence of coronary angioplasty implant and graft: Secondary | ICD-10-CM

## 2017-03-10 ENCOUNTER — Encounter (HOSPITAL_COMMUNITY)
Admission: RE | Admit: 2017-03-10 | Discharge: 2017-03-10 | Disposition: A | Discharge status: Home / Self Care | Payer: BC Managed Care – PPO | Source: Ambulatory Visit | Attending: Cardiology | Admitting: Cardiology

## 2017-03-10 DIAGNOSIS — Z48812 Encounter for surgical aftercare following surgery on the circulatory system: Secondary | ICD-10-CM | POA: Diagnosis not present

## 2017-03-10 DIAGNOSIS — Z955 Presence of coronary angioplasty implant and graft: Secondary | ICD-10-CM

## 2017-03-10 DIAGNOSIS — I214 Non-ST elevation (NSTEMI) myocardial infarction: Secondary | ICD-10-CM

## 2017-03-11 NOTE — Progress Notes (Signed)
Cardiac Individual Treatment Plan  Patient Details  Name: Caycee Wanat MRN: [id number] Date of Birth: 10-12-1975 Referring Provider:     CARDIAC REHAB PHASE II ORIENTATION from 01/28/2017 in [facility]  Referring Provider  Vernell Leep MD      Initial Encounter Date:    CARDIAC REHAB PHASE II ORIENTATION from 01/28/2017 in [facility]  Date  01/28/17  Referring Provider  Vernell Leep MD      Visit Diagnosis: Status post coronary artery stent placement  NSTEMI (non-ST elevated myocardial infarction) ([place])  Patient's Home Medications on Admission:  Current Outpatient Medications:  .  acetaminophen (TYLENOL) 500 MG tablet, Take 500 mg every 6 (six) hours as needed by mouth for mild pain., Disp: , Rfl:  .  aspirin (ASPIRIN CHILDRENS) 81 MG chewable tablet, Chew 1 tablet (81 mg total) by mouth daily., Disp: 90 tablet, Rfl: 3 .  atorvastatin (LIPITOR) 80 MG tablet, Take 1 tablet (80 mg total) by mouth daily at 6 PM., Disp: 90 tablet, Rfl: 3 .  clopidogrel (PLAVIX) 75 MG tablet, Take 75 mg by mouth daily., Disp: , Rfl:  .  fluticasone (FLONASE) 50 MCG/ACT nasal spray, Place 2 sprays daily as needed into both nostrils for allergies. , Disp: , Rfl:  .  guaiFENesin (MUCINEX) 600 MG 12 hr tablet, Take 600 mg by mouth 2 (two) times daily as needed for to loosen phlegm., Disp: , Rfl:  .  ipratropium (ATROVENT) 0.06 % nasal spray, Place 2 sprays into the nose daily as needed for rhinitis. , Disp: , Rfl: 5 .  lisinopril (PRINIVIL,ZESTRIL) 10 MG tablet, Take 1 tablet (10 mg total) by mouth daily. (Patient taking differently: Take 20 mg by mouth daily. ), Disp: 90 tablet, Rfl: 3 .  metoprolol tartrate (LOPRESSOR) 50 MG tablet, Take 1 tablet (50 mg total) by mouth 2 (two) times daily. (Patient taking differently: Take 25 mg by mouth 2 (two) times daily. ), Disp: 60 tablet, Rfl: 3 .  nitroGLYCERIN (NITROSTAT) 0.4 MG SL tablet,  Place 1 tablet (0.4 mg total) under the tongue every 5 (five) minutes as needed for chest pain., Disp: 30 tablet, Rfl: 3 .  pantoprazole (PROTONIX) 40 MG tablet, Take 40 mg daily by mouth., Disp: , Rfl:  .  PROAIR HFA 108 (90 Base) MCG/ACT inhaler, Take 2 puffs by mouth every 4 (four) hours as needed for wheezing or shortness of breath., Disp: , Rfl: 0 .  sertraline (ZOLOFT) 50 MG tablet, Take 50 mg by mouth daily., Disp: , Rfl:  .  Vitamin D, Ergocalciferol, (DRISDOL) 50000 units CAPS capsule, Take 50,000 Units by mouth every Tuesday. , Disp: , Rfl: 0  Past Medical History: Past Medical History:  Diagnosis Date  . Anxiety   . High cholesterol   . Hypertension   . NSTEMI (non-ST elevated myocardial infarction) ([place]) 11/15/2016  . Pre-diabetes     Tobacco Use: Social History   Tobacco Use  Smoking Status Never Smoker  Smokeless Tobacco Never Used    Labs: Recent Review Scientist, physiological    Labs for ITP Cardiac and Pulmonary Rehab Latest Ref Rng & Units 11/16/2016   Cholestrol 0 - 200 mg/dL 275(H)   LDLCALC 0 - 99 mg/dL 190(H)   HDL >40 mg/dL 45   Trlycerides <150 mg/dL 199(H)   Hemoglobin A1c 4.8 - 5.6 % 6.3(H)      Capillary Blood Glucose: Lab Results  Component Value Date   GLUCAP 98 02/03/2017  GLUCAP 99 02/01/2017   GLUCAP 88 02/01/2017   GLUCAP 85 11/20/2016   GLUCAP 78 11/19/2016     Exercise Target Goals:    Exercise Program Goal: Individual exercise prescription set using results from initial 6 min walk test and THRR while considering  patient's activity barriers and safety.   Exercise Prescription Goal: Initial exercise prescription builds to 30-45 minutes a day of aerobic activity, 2-3 days per week.  Home exercise guidelines will be given to patient during program as part of exercise prescription that the participant will acknowledge.  Activity Barriers & Risk Stratification: Activity Barriers & Cardiac Risk Stratification - 01/28/17 1208       Activity Barriers & Cardiac Risk Stratification   Activity Barriers  Deconditioning;Muscular Weakness;Shortness of Breath;Chest Pain/Angina    Cardiac Risk Stratification  High       6 Minute Walk: 6 Minute Walk    Row Name 01/28/17 0928 01/28/17 0947 01/28/17 1208     6 Minute Walk   Phase  Initial  -  -   Distance  1210 feet  -  -   Walk Time  6 minutes  -  -   # of Rest Breaks  0  -  -   MPH  -  2.3  -   METS  -  3.6  -   RPE  11  -  -   Perceived Dyspnea   1  -  -   VO2 Peak  -  12.45  -   Symptoms  Yes (comment)  -  -   Comments  SOB   -  -   Resting HR  -  55 bpm  -   Resting BP  112/64  112/64  -   Resting Oxygen Saturation   98 %  -  -   Exercise Oxygen Saturation  during 6 min walk  99 %  -  -   Max Ex. HR  -  83 bpm  -   Max Ex. BP  118/62  -  -   2 Minute Post BP  -  -  110/68      Oxygen Initial Assessment:   Oxygen Re-Evaluation:   Oxygen Discharge (Final Oxygen Re-Evaluation):   Initial Exercise Prescription: Initial Exercise Prescription - 01/28/17 1200      Date of Initial Exercise RX and Referring Provider   Date  01/28/17    Referring Provider  Vernell Leep MD      Treadmill   MPH  2.3    Grade  0    Minutes  10    METs  2.76      Recumbant Bike   Level  1.5 upright scifit    Minutes  10    METs  2      NuStep   Level  2    SPM  70    Minutes  10    METs  2      Prescription Details   Frequency (times per week)  3    Duration  Progress to 30 minutes of continuous aerobic without signs/symptoms of physical distress      Intensity   THRR 40-80% of Max Heartrate  71-142    Ratings of Perceived Exertion  11-15    Perceived Dyspnea  0-4      Progression   Progression  Continue to progress workloads to maintain intensity without signs/symptoms of physical distress.      Resistance Training  Training Prescription  Yes    Weight  2lbs    Reps  10-15       Perform Capillary Blood Glucose checks as needed.  Exercise  Prescription Changes:  Exercise Prescription Changes    Row Name 02/01/17 1455 02/15/17 1450 03/01/17 1455         Response to Exercise   Blood Pressure (Admit)  132/84  98/67  106/78     Blood Pressure (Exercise)  134/72  107/61  120/80     Blood Pressure (Exit)  100/68  103/72  100/62     Heart Rate (Admit)  74 bpm  65 bpm  61 bpm     Heart Rate (Exercise)  94 bpm  97 bpm  94 bpm     Heart Rate (Exit)  63 bpm  65 bpm  59 bpm     Rating of Perceived Exertion (Exercise)  12  12  11      Symptoms  none  none  none     Duration  Progress to 30 minutes of  aerobic without signs/symptoms of physical distress  Continue with 30 min of aerobic exercise without signs/symptoms of physical distress.  Continue with 30 min of aerobic exercise without signs/symptoms of physical distress.     Intensity  THRR unchanged  THRR unchanged  THRR unchanged       Progression   Progression  Continue to progress workloads to maintain intensity without signs/symptoms of physical distress.  Continue to progress workloads to maintain intensity without signs/symptoms of physical distress.  Continue to progress workloads to maintain intensity without signs/symptoms of physical distress.     Average METs  2.5  3.4  3.7       Resistance Training   Training Prescription  Yes  Yes  Yes     Weight  2lbs  3lbs  3lbs     Reps  10-15  10-15  10-15     Time  10 Minutes  10 Minutes  10 Minutes       Interval Training   Interval Training  No  No  No       Treadmill   MPH  2.3  2.3  2.5     Grade  0  1  2     Minutes  10  10 5  minutes TM walking, 5 minutes track walking with stairs.  5     METs  2.76  3.08  3.6       Recumbant Bike   Level  - upright scifit  2.5 upright scifit  3 upright scifit     Minutes  -  10  10     METs  -  4.2  5.2       NuStep   Level  2  4  4      SPM  70  80  90     Minutes  10  10  10      METs  2.2  2.9  3.1       Track   Laps  -  -  6     Minutes  -  -  5     METs  -  -  3.09         Home Exercise Plan   Plans to continue exercise at  -  Home (comment)  Home (comment)     Frequency  -  Add 2 additional days to program exercise sessions.  Add 2 additional days  to program exercise sessions.     Initial Home Exercises Provided  -  02/08/17  02/08/17        Exercise Comments:  Exercise Comments    Row Name 02/01/17 1455 02/03/17 1600 02/15/17 1502 03/01/17 1455     Exercise Comments  Off to a good start with exercise.  Reviewed METs and goals with patient.  Reviewed METs and goals with patient.  Reviewed METs and goals with patient.       Exercise Goals and Review:  Exercise Goals    Row Name 01/28/17 0929             Exercise Goals   Increase Physical Activity  Yes       Intervention  Provide advice, education, support and counseling about physical activity/exercise needs.;Develop an individualized exercise prescription for aerobic and resistive training based on initial evaluation findings, risk stratification, comorbidities and participant's personal goals.       Expected Outcomes  Achievement of increased cardiorespiratory fitness and enhanced flexibility, muscular endurance and strength shown through measurements of functional capacity and personal statement of participant.       Increase Strength and Stamina  Yes       Intervention  Provide advice, education, support and counseling about physical activity/exercise needs.;Develop an individualized exercise prescription for aerobic and resistive training based on initial evaluation findings, risk stratification, comorbidities and participant's personal goals.       Expected Outcomes  Achievement of increased cardiorespiratory fitness and enhanced flexibility, muscular endurance and strength shown through measurements of functional capacity and personal statement of participant.       Able to understand and use rate of perceived exertion (RPE) scale  Yes       Intervention  Provide education and explanation  on how to use RPE scale       Expected Outcomes  Short Term: Able to use RPE daily in rehab to express subjective intensity level;Long Term:  Able to use RPE to guide intensity level when exercising independently       Knowledge and understanding of Target Heart Rate Range (THRR)  Yes       Intervention  Provide education and explanation of THRR including how the numbers were predicted and where they are located for reference       Expected Outcomes  Short Term: Able to state/look up THRR;Short Term: Able to use daily as guideline for intensity in rehab;Long Term: Able to use THRR to govern intensity when exercising independently       Able to check pulse independently  Yes       Intervention  Provide education and demonstration on how to check pulse in carotid and radial arteries.;Review the importance of being able to check your own pulse for safety during independent exercise       Expected Outcomes  Short Term: Able to explain why pulse checking is important during independent exercise;Long Term: Able to check pulse independently and accurately       Understanding of Exercise Prescription  Yes       Intervention  Provide education, explanation, and written materials on patient's individual exercise prescription       Expected Outcomes  Short Term: Able to explain program exercise prescription;Long Term: Able to explain home exercise prescription to exercise independently          Exercise Goals Re-Evaluation : Exercise Goals Re-Evaluation    Row Name 02/03/17 1710 02/08/17 1530 02/10/17 1520 02/15/17 1502 03/01/17 1455  Exercise Goal Re-Evaluation   Exercise Goals Review  Increase Physical Activity;Understanding of Exercise Prescription  Understanding of Exercise Prescription;Knowledge and understanding of Target Heart Rate Range (THRR);Able to understand and use rate of perceived exertion (RPE) scale  Increase Physical Activity;Increase Strength and Stamina  Increase Physical Activity   Increase Physical Activity   Comments  Patient is walking 2 days/week in addition to exercise at [facility] and plans to join a gym  Reviewed home exercise guidelines with patient including THRR, RPE scale and endpoints for exercise.  Patient would like to be able to walk up the 2 flights of stairs to get to her job. Discussed taking it 1 flight then the elevator and progressing from there. Added walking the stairs 5 minutes in conjunction with walking TM 5 mins, also increased from 2. with 0% incline on TM to 2.3 with 1% incline to help build stamina with inclines.  Patient was able to walk up 2 flights of stairs at work without symptoms. Pt states she had some anxiety but otherwise tolerated the walk well.  Patient is progressing well with exercise. Pt is walking 30 minutes 2 days/week in addition to exercise at [facility].   Expected Outcomes  Patient will continue to exercise at least 5 days/week to help achieve health and fitness goals.  Continue 30 minutes exercise, 5 days/week.  Patient will be able to comfortably walk up 1 flight of stairs at work then progress to walking 2 flights of stairs at work.  Continue to progress workloads to increase strength and  stamina to be able to participate in ADLs.  Continue exercise routine 30 minutes, 5 days/week to help achieve personal health and fitness goals.       Discharge Exercise Prescription (Final Exercise Prescription Changes): Exercise Prescription Changes - 03/01/17 1455      Response to Exercise   Blood Pressure (Admit)  106/78    Blood Pressure (Exercise)  120/80    Blood Pressure (Exit)  100/62    Heart Rate (Admit)  61 bpm    Heart Rate (Exercise)  94 bpm    Heart Rate (Exit)  59 bpm    Rating of Perceived Exertion (Exercise)  11    Symptoms  none    Duration  Continue with 30 min of aerobic exercise without signs/symptoms of physical distress.    Intensity  THRR unchanged      Progression   Progression  Continue to progress workloads to  maintain intensity without signs/symptoms of physical distress.    Average METs  3.7      Resistance Training   Training Prescription  Yes    Weight  3lbs    Reps  10-15    Time  10 Minutes      Interval Training   Interval Training  No      Treadmill   MPH  2.5    Grade  2    Minutes  5    METs  3.6      Recumbant Bike   Level  3 upright scifit    Minutes  10    METs  5.2      NuStep   Level  4    SPM  90    Minutes  10    METs  3.1      Track   Laps  6    Minutes  5    METs  3.09      Home Exercise Plan   Plans to  continue exercise at  Home (comment)    Frequency  Add 2 additional days to program exercise sessions.    Initial Home Exercises Provided  02/08/17       Nutrition:  Target Goals: Understanding of nutrition guidelines, daily intake of sodium 1500mg , cholesterol 200mg , calories 30% from fat and 7% or less from saturated fats, daily to have 5 or more servings of fruits and vegetables.  Biometrics: Pre Biometrics - 01/28/17 0948      Pre Biometrics   Height  5\' 3"  (1.6 m)    Weight  192 lb 3.9 oz (87.2 kg)    Waist Circumference  41.5 inches    Hip Circumference  43.5 inches    Waist to Hip Ratio  0.95 %    BMI (Calculated)  34.06    Triceps Skinfold  33 mm    % Body Fat  43.9 %    Grip Strength  33 kg    Flexibility  14 in    Single Leg Stand  20.28 seconds        Nutrition Therapy Plan and Nutrition Goals: Nutrition Therapy & Goals - 01/28/17 1054      Nutrition Therapy   Diet  Heart Healthy      Personal Nutrition Goals   Nutrition Goal  Pt to identify and limit food sources of saturated fat, trans fat, and sodium    Personal Goal #2  Pt to identify food quantities necessary to achieve weight loss of 6-24 lb (2.7-10.9 kg) at graduation from cardiac rehab.        Intervention Plan   Intervention  Prescribe, educate and counsel regarding individualized specific dietary modifications aiming towards targeted core components such as  weight, hypertension, lipid management, diabetes, heart failure and other comorbidities.    Expected Outcomes  Short Term Goal: Understand basic principles of dietary content, such as calories, fat, sodium, cholesterol and nutrients.;Long Term Goal: Adherence to prescribed nutrition plan.       Nutrition Assessments: Nutrition Assessments - 01/28/17 1055      MEDFICTS Scores   Pre Score  33       Nutrition Goals Re-Evaluation:   Nutrition Goals Re-Evaluation:   Nutrition Goals Discharge (Final Nutrition Goals Re-Evaluation):   Psychosocial: Target Goals: Acknowledge presence or absence of significant depression and/or stress, maximize coping skills, provide positive support system. Participant is able to verbalize types and ability to use techniques and skills needed for reducing stress and depression.  Initial Review & Psychosocial Screening: Initial Psych Review & Screening - 01/28/17 1259      Initial Review   Current issues with  Current Anxiety/Panic;Current Stress Concerns;Current Sleep Concerns    Source of Stress Concerns  Unable to participate in former interests or hobbies;Unable to perform yard/household activities;Chronic Illness    Comments  Pt reports feeling anxious regarding having another heart event      Family Dynamics   Good Support System?  Yes      Barriers   Psychosocial barriers to participate in program  The patient should benefit from training in stress management and relaxation.      Screening Interventions   Interventions  To provide support and resources with identified psychosocial needs;Encouraged to exercise       Quality of Life Scores: Quality of Life - 01/28/17 0949      Quality of Life Scores   Health/Function Pre  16.89 %    Socioeconomic Pre  22.21 %    Psych/Spiritual Pre  22.21 %    Family Pre  23.63 %    GLOBAL Pre  20.06 %      Scores of 19 and below usually indicate a poorer quality of life in these areas.  A difference  of  2-3 points is a clinically meaningful difference.  A difference of 2-3 points in the total score of the Quality of Life Index has been associated with significant improvement in overall quality of life, self-image, physical symptoms, and general health in studies assessing change in quality of life.  PHQ-9: Recent Review Flowsheet Data    Depression screen [facility] 2/9 02/01/2017   Decreased Interest 0   Down, Depressed, Hopeless 1    PHQ - 2 Score 1     Interpretation of Total Score  Total Score Depression Severity:  1-4 = Minimal depression, 5-9 = Mild depression, 10-14 = Moderate depression, 15-19 = Moderately severe depression, 20-27 = Severe depression   Psychosocial Evaluation and Intervention: Psychosocial Evaluation - 01/28/17 1302      Psychosocial Evaluation & Interventions   Interventions  Therapist referral;Physician referral;Relaxation education;Stress management education;Encouraged to exercise with the program and follow exercise prescription    Comments  Pt to restablish with Psychologist that she has seen in the past.  Pt to ask primary MD for anxiety medication that was discussed at the last follow up.  Appointment made with chaplain at spiritual care depart for Monday 1/4 after exercise.    Expected Outcomes  Pt will seek help when needed and establish positive and healthy coping skills to contribute in a meaningful way to her community.    Continue Psychosocial Services   Follow up required by staff       Psychosocial Re-Evaluation: Psychosocial Re-Evaluation    Row Name 02/10/17 1703 03/11/17 1458           Psychosocial Re-Evaluation   Current issues with  Current Anxiety/Panic  Current Anxiety/Panic      Comments  Janyth will continue to participate in cardiac rehab and meet with her therapist. Kally has been presribed zoloft for anxiety  Celes will continue to participate in cardiac rehab and meet with her therapist. Ricky has been presribed zoloft for  anxiety      Expected Outcomes  Phuong will have less stress and anxiety upon completion of cardiac rehab.  Tomica will have less stress and anxiety upon completion of cardiac rehab. Shontavia has been feeling better      Interventions  Stress management education;Physician referral;Therapist referral;Encouraged to attend Cardiac Rehabilitation for the exercise Tynisha meets with her therapist on a regular basis and saw her primary provider last week for anxiety  Stress management education;Physician referral;Therapist referral;Encouraged to attend Cardiac Rehabilitation for the exercise      Continue Psychosocial Services   Follow up required by staff  Follow up required by staff         Psychosocial Discharge (Final Psychosocial Re-Evaluation): Psychosocial Re-Evaluation - 03/11/17 1458      Psychosocial Re-Evaluation   Current issues with  Current Anxiety/Panic    Comments  Kloee will continue to participate in cardiac rehab and meet with her therapist. Gresia has been presribed zoloft for anxiety    Expected Outcomes  Duha will have less stress and anxiety upon completion of cardiac rehab. Danyell has been feeling better    Interventions  Stress management education;Physician referral;Therapist referral;Encouraged to attend Cardiac Rehabilitation for the exercise    Continue Psychosocial Services   Follow up required by staff  Vocational Rehabilitation: Provide vocational rehab assistance to qualifying candidates.   Vocational Rehab Evaluation & Intervention: Vocational Rehab - 01/28/17 1306      Initial Vocational Rehab Evaluation & Intervention   Assessment shows need for Vocational Rehabilitation  No Pt has returned to work as a Lexicographerlibrian at [employer]       Education: Education Goals: Education classes will be provided on a weekly basis, covering required topics. Participant will state understanding/return demonstration of topics presented.  Learning  Barriers/Preferences: Learning Barriers/Preferences - 01/28/17 [id number]      Learning Barriers/Preferences   Learning Barriers  Sight    Learning Preferences  Written Material       Education Topics: Count Your Pulse:  -Group instruction provided by verbal instruction, demonstration, patient participation and written materials to support subject.  Instructors address importance of being able to find your pulse and how to count your pulse when at home without a heart monitor.  Patients get hands on experience counting their pulse with staff help and individually.   [facility]  Date  02/12/17  Educator  RN      Heart Attack, Angina, and Risk Factor Modification:  -Group instruction provided by verbal instruction, video, and written materials to support subject.  Instructors address signs and symptoms of angina and heart attacks.    Also discuss risk factors for heart disease and how to make changes to improve heart health risk factors.   Functional Fitness:  -Group instruction provided by verbal instruction, demonstration, patient participation, and written materials to support subject.  Instructors address safety measures for doing things around the house.  Discuss how to get up and down off the floor, how to pick things up properly, how to safely get out of a chair without assistance, and balance training.   Meditation and Mindfulness:  -Group instruction provided by verbal instruction, patient participation, and written materials to support subject.  Instructor addresses importance of mindfulness and meditation practice to help reduce stress and improve awareness.  Instructor also leads participants through a meditation exercise.    Stretching for Flexibility and Mobility:  -Group instruction provided by verbal instruction, patient participation, and written materials to support subject.  Instructors lead  participants through series of stretches that are designed to increase flexibility thus improving mobility.  These stretches are additional exercise for major muscle groups that are typically performed during regular warm up and cool down.   Hands Only CPR:  -Group verbal, video, and participation provides a basic overview of AHA guidelines for community CPR. Role-play of emergencies allow participants the opportunity to practice calling for help and chest compression technique with discussion of AED use.   CARDIAC REHAB [facility]  Date  02/19/17  Instruction Review Code  2- Demonstrated Understanding      Hypertension: -Group verbal and written instruction that provides a basic overview of hypertension including the most recent diagnostic guidelines, risk factor reduction with self-care instructions and medication management.    Nutrition I class: Heart Healthy Eating:  -Group instruction provided by PowerPoint slides, verbal discussion, and written materials to support subject matter. The instructor gives an explanation and review of the Therapeutic Lifestyle Changes diet recommendations, which includes a discussion on lipid goals, dietary fat, sodium, fiber, plant stanol/sterol esters, sugar, and the components of a well-balanced, healthy diet.   CARDIAC REHAB PHASE  II EXERCISE from 03/10/2017 in [facility] CARDIAC REHAB  Date  01/28/17  Educator  RD      Nutrition II class: Lifestyle Skills:  -Group instruction provided by PowerPoint slides, verbal discussion, and written materials to support subject matter. The instructor gives an explanation and review of label reading, grocery shopping for heart health, heart healthy recipe modifications, and ways to make healthier choices when eating out.   CARDIAC REHAB PHASE II EXERCISE from 03/10/2017 in  [facility] CARDIAC REHAB  Date  01/28/17   Educator  RD      Diabetes Question & Answer:  -Group instruction provided by PowerPoint slides, verbal discussion, and written materials to support subject matter. The instructor gives an explanation and review of diabetes co-morbidities, pre- and post-prandial blood glucose goals, pre-exercise blood glucose goals, signs, symptoms, and treatment of hypoglycemia and hyperglycemia, and foot care basics.   CARDIAC REHAB PHASE II EXERCISE from 03/10/2017 in [facility] CARDIAC REHAB  Date  02/05/17  Educator  RD      Diabetes Blitz:  -Group instruction provided by PowerPoint slides, verbal discussion, and written materials to support subject matter. The instructor gives an explanation and review of the physiology behind type 1 and type 2 diabetes, diabetes medications and rational behind using different medications, pre- and post-prandial blood glucose recommendations and Hemoglobin A1c goals, diabetes diet, and exercise including blood glucose guidelines for exercising safely.    Portion Distortion:  -Group instruction provided by PowerPoint slides, verbal discussion, written materials, and food models to support subject matter. The instructor gives an explanation of serving size versus portion size, changes in portions sizes over the last 20 years, and what consists of a serving from each food group.   CARDIAC REHAB PHASE II EXERCISE from 03/10/2017 in [facility] CARDIAC REHAB  Date  02/17/17  Educator  RD  Instruction Review Code  2- Demonstrated Understanding      Stress Management:  -Group instruction provided by verbal instruction, video, and written materials to support subject matter.  Instructors review role of stress in heart disease and how to cope with stress positively.     CARDIAC REHAB PHASE II EXERCISE from 03/10/2017 in [facility] CARDIAC REHAB  Date  02/24/17  Instruction Review Code  2- Demonstrated Understanding       Exercising on Your Own:  -Group instruction provided by verbal instruction, power point, and written materials to support subject.  Instructors discuss benefits of exercise, components of exercise, frequency and intensity of exercise, and end points for exercise.  Also discuss use of nitroglycerin and activating EMS.  Review options of places to exercise outside of rehab.  Review guidelines for sex with heart disease.   CARDIAC REHAB PHASE II EXERCISE from 03/10/2017 in [facility] CARDIAC REHAB  Date  02/03/17  Educator  EP      Cardiac Drugs I:  -Group instruction provided by verbal instruction and written materials to support subject.  Instructor reviews cardiac drug classes: antiplatelets, anticoagulants, beta blockers, and statins.  Instructor discusses reasons, side effects, and lifestyle considerations for each drug class.   CARDIAC REHAB PHASE II EXERCISE from 03/10/2017 in [facility] CARDIAC REHAB  Date  03/03/17  Instruction Review Code  2- Demonstrated Understanding      Cardiac Drugs II:  -Group instruction provided by verbal instruction and written materials to support subject.  Instructor reviews cardiac drug classes: angiotensin converting enzyme  inhibitors (ACE-I), angiotensin II receptor blockers (ARBs), nitrates, and calcium channel blockers.  Instructor discusses reasons, side effects, and lifestyle considerations for each drug class.   CARDIAC REHAB PHASE II EXERCISE from 03/10/2017 in [facility] CARDIAC REHAB  Date  03/03/17  Instruction Review Code  2- Demonstrated Understanding      Anatomy and Physiology of the Circulatory System:  Group verbal and written instruction and models provide basic cardiac anatomy and physiology, with the coronary electrical and arterial systems. Review of: AMI, Angina, Valve disease, Heart Failure, Peripheral Artery Disease, Cardiac Arrhythmia, Pacemakers, and the ICD.   CARDIAC  REHAB PHASE II EXERCISE from 03/10/2017 in [facility] CARDIAC REHAB  Date  03/10/17  Instruction Review Code  2- Demonstrated Understanding      Other Education:  -Group or individual verbal, written, or video instructions that support the educational goals of the cardiac rehab program.   Holiday Eating Survival Tips:  -Group instruction provided by PowerPoint slides, verbal discussion, and written materials to support subject matter. The instructor gives patients tips, tricks, and techniques to help them not only survive but enjoy the holidays despite the onslaught of food that accompanies the holidays.   Knowledge Questionnaire Score: Knowledge Questionnaire Score - 01/28/17 0849      Knowledge Questionnaire Score   Pre Score  20/24       Core Components/Risk Factors/Patient Goals at Admission: Personal Goals and Risk Factors at Admission - 01/28/17 1211      Core Components/Risk Factors/Patient Goals on Admission    Weight Management  Yes;Obesity;Weight Maintenance;Weight Loss    Intervention  Weight Management: Develop a combined nutrition and exercise program designed to reach desired caloric intake, while maintaining appropriate intake of nutrient and fiber, sodium and fats, and appropriate energy expenditure required for the weight goal.;Weight Management: Provide education and appropriate resources to help participant work on and attain dietary goals.;Weight Management/Obesity: Establish reasonable short term and long term weight goals.;Obesity: Provide education and appropriate resources to help participant work on and attain dietary goals.    Admit Weight  192 lb 3.9 oz (87.2 kg)    Goal Weight: Short Term  185 lb (83.9 kg)    Goal Weight: Long Term  155 lb (70.3 kg)    Expected Outcomes  Short Term: Continue to assess and modify interventions until short term weight is achieved;Long Term: Adherence to nutrition and physical activity/exercise program aimed  toward attainment of established weight goal;Weight Maintenance: Understanding of the daily nutrition guidelines, which includes 25-35% calories from fat, 7% or less cal from saturated fats, less than 200mg  cholesterol, less than 1.5gm of sodium, & 5 or more servings of fruits and vegetables daily;Weight Loss: Understanding of general recommendations for a balanced deficit meal plan, which promotes 1-2 lb weight loss per week and includes a negative energy balance of [phone] kcal/d;Understanding recommendations for meals to include 15-35% energy as protein, 25-35% energy from fat, 35-60% energy from carbohydrates, less than 200mg  of dietary cholesterol, 20-35 gm of total fiber daily;Understanding of distribution of calorie intake throughout the day with the consumption of 4-5 meals/snacks    Hypertension  Yes    Intervention  Provide education on lifestyle modifcations including regular physical activity/exercise, weight management, moderate sodium restriction and increased consumption of fresh fruit, vegetables, and low fat dairy, alcohol moderation, and smoking cessation.;Monitor prescription use compliance.    Expected Outcomes  Short Term: Continued assessment and intervention until BP is < 140/46mm HG in hypertensive participants. <  130/32mm HG in hypertensive participants with diabetes, heart failure or chronic kidney disease.;Long Term: Maintenance of blood pressure at goal levels.    Lipids  Yes    Intervention  Provide education and support for participant on nutrition & aerobic/resistive exercise along with prescribed medications to achieve LDL 70mg , HDL >40mg .    Expected Outcomes  Short Term: Participant states understanding of desired cholesterol values and is compliant with medications prescribed. Participant is following exercise prescription and nutrition guidelines.;Long Term: Cholesterol controlled with medications as prescribed, with individualized exercise RX and with personalized  nutrition plan. Value goals: LDL < 70mg , HDL > 40 mg.    Stress  Yes    Intervention  Offer individual and/or small group education and counseling on adjustment to heart disease, stress management and health-related lifestyle change. Teach and support self-help strategies.;Refer participants experiencing significant psychosocial distress to appropriate mental health specialists for further evaluation and treatment. When possible, include family members and significant others in education/counseling sessions.    Expected Outcomes  Short Term: Participant demonstrates changes in health-related behavior, relaxation and other stress management skills, ability to obtain effective social support, and compliance with psychotropic medications if prescribed.;Long Term: Emotional wellbeing is indicated by absence of clinically significant psychosocial distress or social isolation.    Personal Goal Other  Yes    Personal Goal  Decrease fear avoidance and anxiety about recent cardiac event. Build confidence and learn about exercise/activity limitations    Intervention  Provide counseling on stress management and coping mechanisms. Further discuss activity restrictions and exercise programming to build on confidence with activity.     Expected Outcomes  Pt will have increase confidence and motivation for physical actvity/exercise and decrease stress and worries about cardiac event. More importantly, pt will have a better understanding of activity limitations and reduce modifiable risk factors.        Core Components/Risk Factors/Patient Goals Review:  Goals and Risk Factor Review    Row Name 02/10/17 1659 03/11/17 1455           Core Components/Risk Factors/Patient Goals Review   Personal Goals Review  Weight Management/Obesity;Lipids;Hypertension;Stress  Weight Management/Obesity;Lipids;Hypertension;Stress      Review  Tiasha'a vital signs have bee stable. Janijah is off to a good start ton exercise  Harrison'a  vital signs have bee stable. Donnamaria is doing well with exercise and has not reported any recent complaints of chest pain      Expected Outcomes  Samara will continue to take her medications for HTN, Hyperlipidemia and follow a heart healthy diabetic diet.  Imberly will continue to take her medications for HTN, Hyperlipidemia and follow a heart healthy diabetic diet.         Core Components/Risk Factors/Patient Goals at Discharge (Final Review):  Goals and Risk Factor Review - 03/11/17 1455      Core Components/Risk Factors/Patient Goals Review   Personal Goals Review  Weight Management/Obesity;Lipids;Hypertension;Stress    Review  Jaela'a vital signs have bee stable. Tehya is doing well with exercise and has not reported any recent complaints of chest pain    Expected Outcomes  Mallarie will continue to take her medications for HTN, Hyperlipidemia and follow a heart healthy diabetic diet.       ITP Comments: ITP Comments    Row Name 01/28/17 0926 02/10/17 1655 03/11/17 1449       ITP Comments  Dr. Armanda Magic, Medical Director  30 day ITP review. Kelee is off to a good start to exercise. Will continue  to monitor for signs of chest pain and give emotional support for anxiety  30 day ITP review. Patient with good attendance and participation in phase 2 cardiac rehab. Will continue to give positive feedback to the patient        Comments: See ITP comments.Gladstone Lighter, RN,BSN 03/11/2017 3:01 PM

## 2017-03-12 ENCOUNTER — Encounter (HOSPITAL_COMMUNITY)
Admission: RE | Admit: 2017-03-12 | Discharge: 2017-03-12 | Disposition: A | Discharge status: Home / Self Care | Payer: BC Managed Care – PPO | Source: Ambulatory Visit | Attending: Cardiology | Admitting: Cardiology

## 2017-03-12 DIAGNOSIS — Z48812 Encounter for surgical aftercare following surgery on the circulatory system: Secondary | ICD-10-CM | POA: Diagnosis not present

## 2017-03-12 DIAGNOSIS — I214 Non-ST elevation (NSTEMI) myocardial infarction: Secondary | ICD-10-CM

## 2017-03-12 DIAGNOSIS — Z955 Presence of coronary angioplasty implant and graft: Secondary | ICD-10-CM

## 2017-03-15 ENCOUNTER — Telehealth (HOSPITAL_COMMUNITY): Payer: Self-pay | Admitting: Family Medicine

## 2017-03-15 ENCOUNTER — Encounter (HOSPITAL_COMMUNITY): Payer: BC Managed Care – PPO

## 2017-03-17 ENCOUNTER — Encounter (HOSPITAL_COMMUNITY)
Admission: RE | Admit: 2017-03-17 | Discharge: 2017-03-17 | Disposition: A | Discharge status: Home / Self Care | Payer: BC Managed Care – PPO | Source: Ambulatory Visit | Attending: Cardiology | Admitting: Cardiology

## 2017-03-17 DIAGNOSIS — Z48812 Encounter for surgical aftercare following surgery on the circulatory system: Secondary | ICD-10-CM | POA: Diagnosis not present

## 2017-03-17 DIAGNOSIS — Z955 Presence of coronary angioplasty implant and graft: Secondary | ICD-10-CM

## 2017-03-17 DIAGNOSIS — I214 Non-ST elevation (NSTEMI) myocardial infarction: Secondary | ICD-10-CM

## 2017-03-19 ENCOUNTER — Encounter (HOSPITAL_COMMUNITY): Payer: BC Managed Care – PPO

## 2017-03-22 ENCOUNTER — Encounter (HOSPITAL_COMMUNITY)
Admission: RE | Admit: 2017-03-22 | Discharge: 2017-03-22 | Disposition: A | Discharge status: Home / Self Care | Payer: BC Managed Care – PPO | Source: Ambulatory Visit | Attending: Cardiology | Admitting: Cardiology

## 2017-03-22 DIAGNOSIS — I214 Non-ST elevation (NSTEMI) myocardial infarction: Secondary | ICD-10-CM | POA: Diagnosis present

## 2017-03-22 DIAGNOSIS — Z955 Presence of coronary angioplasty implant and graft: Secondary | ICD-10-CM | POA: Insufficient documentation

## 2017-03-22 DIAGNOSIS — Z48812 Encounter for surgical aftercare following surgery on the circulatory system: Secondary | ICD-10-CM | POA: Diagnosis not present

## 2017-03-22 DIAGNOSIS — R001 Bradycardia, unspecified: Secondary | ICD-10-CM | POA: Diagnosis not present

## 2017-03-22 DIAGNOSIS — R079 Chest pain, unspecified: Secondary | ICD-10-CM | POA: Diagnosis not present

## 2017-03-24 ENCOUNTER — Encounter (HOSPITAL_COMMUNITY)
Admission: RE | Admit: 2017-03-24 | Discharge: 2017-03-24 | Disposition: A | Discharge status: Home / Self Care | Payer: BC Managed Care – PPO | Source: Ambulatory Visit | Attending: Cardiology | Admitting: Cardiology

## 2017-03-24 DIAGNOSIS — Z955 Presence of coronary angioplasty implant and graft: Secondary | ICD-10-CM

## 2017-03-24 DIAGNOSIS — I214 Non-ST elevation (NSTEMI) myocardial infarction: Secondary | ICD-10-CM

## 2017-03-24 DIAGNOSIS — Z48812 Encounter for surgical aftercare following surgery on the circulatory system: Secondary | ICD-10-CM | POA: Diagnosis not present

## 2017-03-26 ENCOUNTER — Encounter (HOSPITAL_COMMUNITY)
Admission: RE | Admit: 2017-03-26 | Discharge: 2017-03-26 | Disposition: A | Discharge status: Home / Self Care | Payer: BC Managed Care – PPO | Source: Ambulatory Visit | Attending: Cardiology | Admitting: Cardiology

## 2017-03-26 DIAGNOSIS — Z955 Presence of coronary angioplasty implant and graft: Secondary | ICD-10-CM

## 2017-03-26 DIAGNOSIS — Z48812 Encounter for surgical aftercare following surgery on the circulatory system: Secondary | ICD-10-CM | POA: Diagnosis not present

## 2017-03-26 DIAGNOSIS — I214 Non-ST elevation (NSTEMI) myocardial infarction: Secondary | ICD-10-CM

## 2017-03-29 ENCOUNTER — Encounter (HOSPITAL_COMMUNITY)
Admission: RE | Admit: 2017-03-29 | Discharge: 2017-03-29 | Disposition: A | Discharge status: Home / Self Care | Payer: BC Managed Care – PPO | Source: Ambulatory Visit | Attending: Cardiology | Admitting: Cardiology

## 2017-03-29 DIAGNOSIS — Z955 Presence of coronary angioplasty implant and graft: Secondary | ICD-10-CM

## 2017-03-29 DIAGNOSIS — I214 Non-ST elevation (NSTEMI) myocardial infarction: Secondary | ICD-10-CM

## 2017-03-29 DIAGNOSIS — Z48812 Encounter for surgical aftercare following surgery on the circulatory system: Secondary | ICD-10-CM | POA: Diagnosis not present

## 2017-03-31 ENCOUNTER — Encounter (HOSPITAL_COMMUNITY)
Admission: RE | Admit: 2017-03-31 | Discharge: 2017-03-31 | Disposition: A | Discharge status: Home / Self Care | Payer: BC Managed Care – PPO | Source: Ambulatory Visit | Attending: Cardiology | Admitting: Cardiology

## 2017-03-31 DIAGNOSIS — Z955 Presence of coronary angioplasty implant and graft: Secondary | ICD-10-CM

## 2017-03-31 DIAGNOSIS — Z48812 Encounter for surgical aftercare following surgery on the circulatory system: Secondary | ICD-10-CM | POA: Diagnosis not present

## 2017-03-31 DIAGNOSIS — I214 Non-ST elevation (NSTEMI) myocardial infarction: Secondary | ICD-10-CM

## 2017-04-02 ENCOUNTER — Encounter (HOSPITAL_COMMUNITY)
Admission: RE | Admit: 2017-04-02 | Discharge: 2017-04-02 | Disposition: A | Discharge status: Home / Self Care | Payer: BC Managed Care – PPO | Source: Ambulatory Visit | Attending: Cardiology | Admitting: Cardiology

## 2017-04-02 DIAGNOSIS — Z48812 Encounter for surgical aftercare following surgery on the circulatory system: Secondary | ICD-10-CM | POA: Diagnosis not present

## 2017-04-02 DIAGNOSIS — I214 Non-ST elevation (NSTEMI) myocardial infarction: Secondary | ICD-10-CM

## 2017-04-02 DIAGNOSIS — Z955 Presence of coronary angioplasty implant and graft: Secondary | ICD-10-CM

## 2017-04-05 ENCOUNTER — Telehealth (HOSPITAL_COMMUNITY): Payer: Self-pay | Admitting: Family Medicine

## 2017-04-05 ENCOUNTER — Encounter (HOSPITAL_COMMUNITY): Payer: BC Managed Care – PPO

## 2017-04-07 ENCOUNTER — Encounter (HOSPITAL_COMMUNITY)
Admission: RE | Admit: 2017-04-07 | Discharge: 2017-04-07 | Disposition: A | Discharge status: Home / Self Care | Payer: BC Managed Care – PPO | Source: Ambulatory Visit | Attending: Cardiology | Admitting: Cardiology

## 2017-04-07 DIAGNOSIS — Z48812 Encounter for surgical aftercare following surgery on the circulatory system: Secondary | ICD-10-CM | POA: Diagnosis not present

## 2017-04-07 DIAGNOSIS — I214 Non-ST elevation (NSTEMI) myocardial infarction: Secondary | ICD-10-CM

## 2017-04-07 DIAGNOSIS — Z955 Presence of coronary angioplasty implant and graft: Secondary | ICD-10-CM

## 2017-04-08 NOTE — Progress Notes (Signed)
Cardiac Individual Treatment Plan  Patient Details  Name: Kristine Newman MRN: [id number] Date of Birth: 10-12-1975 Referring Provider:     CARDIAC REHAB PHASE II ORIENTATION from 01/28/2017 in [facility]  Referring Provider  Vernell Leep MD      Initial Encounter Date:    CARDIAC REHAB PHASE II ORIENTATION from 01/28/2017 in [facility]  Date  01/28/17  Referring Provider  Vernell Leep MD      Visit Diagnosis: Status post coronary artery stent placement  NSTEMI (non-ST elevated myocardial infarction) ([place])  Patient's Home Medications on Admission:  Current Outpatient Medications:  .  acetaminophen (TYLENOL) 500 MG tablet, Take 500 mg every 6 (six) hours as needed by mouth for mild pain., Disp: , Rfl:  .  aspirin (ASPIRIN CHILDRENS) 81 MG chewable tablet, Chew 1 tablet (81 mg total) by mouth daily., Disp: 90 tablet, Rfl: 3 .  atorvastatin (LIPITOR) 80 MG tablet, Take 1 tablet (80 mg total) by mouth daily at 6 PM., Disp: 90 tablet, Rfl: 3 .  clopidogrel (PLAVIX) 75 MG tablet, Take 75 mg by mouth daily., Disp: , Rfl:  .  fluticasone (FLONASE) 50 MCG/ACT nasal spray, Place 2 sprays daily as needed into both nostrils for allergies. , Disp: , Rfl:  .  guaiFENesin (MUCINEX) 600 MG 12 hr tablet, Take 600 mg by mouth 2 (two) times daily as needed for to loosen phlegm., Disp: , Rfl:  .  ipratropium (ATROVENT) 0.06 % nasal spray, Place 2 sprays into the nose daily as needed for rhinitis. , Disp: , Rfl: 5 .  lisinopril (PRINIVIL,ZESTRIL) 10 MG tablet, Take 1 tablet (10 mg total) by mouth daily. (Patient taking differently: Take 20 mg by mouth daily. ), Disp: 90 tablet, Rfl: 3 .  metoprolol tartrate (LOPRESSOR) 50 MG tablet, Take 1 tablet (50 mg total) by mouth 2 (two) times daily. (Patient taking differently: Take 25 mg by mouth 2 (two) times daily. ), Disp: 60 tablet, Rfl: 3 .  nitroGLYCERIN (NITROSTAT) 0.4 MG SL tablet,  Place 1 tablet (0.4 mg total) under the tongue every 5 (five) minutes as needed for chest pain., Disp: 30 tablet, Rfl: 3 .  pantoprazole (PROTONIX) 40 MG tablet, Take 40 mg daily by mouth., Disp: , Rfl:  .  PROAIR HFA 108 (90 Base) MCG/ACT inhaler, Take 2 puffs by mouth every 4 (four) hours as needed for wheezing or shortness of breath., Disp: , Rfl: 0 .  sertraline (ZOLOFT) 50 MG tablet, Take 50 mg by mouth daily., Disp: , Rfl:  .  Vitamin D, Ergocalciferol, (DRISDOL) 50000 units CAPS capsule, Take 50,000 Units by mouth every Tuesday. , Disp: , Rfl: 0  Past Medical History: Past Medical History:  Diagnosis Date  . Anxiety   . High cholesterol   . Hypertension   . NSTEMI (non-ST elevated myocardial infarction) ([place]) 11/15/2016  . Pre-diabetes     Tobacco Use: Social History   Tobacco Use  Smoking Status Never Smoker  Smokeless Tobacco Never Used    Labs: Recent Review Scientist, physiological    Labs for ITP Cardiac and Pulmonary Rehab Latest Ref Rng & Units 11/16/2016   Cholestrol 0 - 200 mg/dL 275(H)   LDLCALC 0 - 99 mg/dL 190(H)   HDL >40 mg/dL 45   Trlycerides <150 mg/dL 199(H)   Hemoglobin A1c 4.8 - 5.6 % 6.3(H)      Capillary Blood Glucose: Lab Results  Component Value Date   GLUCAP 98 02/03/2017  GLUCAP 99 02/01/2017   GLUCAP 88 02/01/2017   GLUCAP 85 11/20/2016   GLUCAP 78 11/19/2016     Exercise Target Goals:    Exercise Program Goal: Individual exercise prescription set using results from initial 6 min walk test and THRR while considering  patient's activity barriers and safety.   Exercise Prescription Goal: Initial exercise prescription builds to 30-45 minutes a day of aerobic activity, 2-3 days per week.  Home exercise guidelines will be given to patient during program as part of exercise prescription that the participant will acknowledge.  Activity Barriers & Risk Stratification: Activity Barriers & Cardiac Risk Stratification - 01/28/17 1208       Activity Barriers & Cardiac Risk Stratification   Activity Barriers  Deconditioning;Muscular Weakness;Shortness of Breath;Chest Pain/Angina    Cardiac Risk Stratification  High       6 Minute Walk: 6 Minute Walk    Row Name 01/28/17 0928 01/28/17 0947 01/28/17 1208     6 Minute Walk   Phase  Initial  -  -   Distance  1210 feet  -  -   Walk Time  6 minutes  -  -   # of Rest Breaks  0  -  -   MPH  -  2.3  -   METS  -  3.6  -   RPE  11  -  -   Perceived Dyspnea   1  -  -   VO2 Peak  -  12.45  -   Symptoms  Yes (comment)  -  -   Comments  SOB   -  -   Resting HR  -  55 bpm  -   Resting BP  112/64  112/64  -   Resting Oxygen Saturation   98 %  -  -   Exercise Oxygen Saturation  during 6 min walk  99 %  -  -   Max Ex. HR  -  83 bpm  -   Max Ex. BP  118/62  -  -   2 Minute Post BP  -  -  110/68      Oxygen Initial Assessment:   Oxygen Re-Evaluation:   Oxygen Discharge (Final Oxygen Re-Evaluation):   Initial Exercise Prescription: Initial Exercise Prescription - 01/28/17 1200      Date of Initial Exercise RX and Referring Provider   Date  01/28/17    Referring Provider  Vernell Leep MD      Treadmill   MPH  2.3    Grade  0    Minutes  10    METs  2.76      Recumbant Bike   Level  1.5 upright scifit    Minutes  10    METs  2      NuStep   Level  2    SPM  70    Minutes  10    METs  2      Prescription Details   Frequency (times per week)  3    Duration  Progress to 30 minutes of continuous aerobic without signs/symptoms of physical distress      Intensity   THRR 40-80% of Max Heartrate  71-142    Ratings of Perceived Exertion  11-15    Perceived Dyspnea  0-4      Progression   Progression  Continue to progress workloads to maintain intensity without signs/symptoms of physical distress.      Resistance Training  Training Prescription  Yes    Weight  2lbs    Reps  10-15       Perform Capillary Blood Glucose checks as needed.  Exercise  Prescription Changes: Exercise Prescription Changes    Row Name 02/01/17 1455 02/15/17 1450 03/01/17 1455 04/02/17 1505       Response to Exercise   Blood Pressure (Admit)  132/84  98/67  106/78  110/60    Blood Pressure (Exercise)  134/72  107/61  120/80  124/74    Blood Pressure (Exit)  100/68  103/72  100/62  121/74    Heart Rate (Admit)  74 bpm  65 bpm  61 bpm  57 bpm    Heart Rate (Exercise)  94 bpm  97 bpm  94 bpm  94 bpm    Heart Rate (Exit)  63 bpm  65 bpm  59 bpm  55 bpm    Rating of Perceived Exertion (Exercise)  '12  12  11  12    ' Symptoms  none  none  none  none    Duration  Progress to 30 minutes of  aerobic without signs/symptoms of physical distress  Continue with 30 min of aerobic exercise without signs/symptoms of physical distress.  Continue with 30 min of aerobic exercise without signs/symptoms of physical distress.  Continue with 30 min of aerobic exercise without signs/symptoms of physical distress.    Intensity  THRR unchanged  THRR unchanged  THRR unchanged  THRR unchanged      Progression   Progression  Continue to progress workloads to maintain intensity without signs/symptoms of physical distress.  Continue to progress workloads to maintain intensity without signs/symptoms of physical distress.  Continue to progress workloads to maintain intensity without signs/symptoms of physical distress.  Continue to progress workloads to maintain intensity without signs/symptoms of physical distress.    Average METs  2.5  3.4  3.7  4.2      Resistance Training   Training Prescription  Yes  Yes  Yes  Yes    Weight  2lbs  3lbs  3lbs  3lbs    Reps  10-15  10-15  10-15  10-15    Time  10 Minutes  10 Minutes  10 Minutes  10 Minutes      Interval Training   Interval Training  No  No  No  No      Treadmill   MPH  2.3  2.3  2.5  2.6    Grade  0  '1  2  3    ' Minutes  '10  10 5 ' minutes TM walking, 5 minutes track walking with stairs.  5  5    METs  2.76  3.08  3.6  4.07       Recumbant Bike   Level  - upright scifit  2.5 upright scifit  3 upright scifit  3.5 upright scifit    Minutes  -  '10  10  10    ' METs  -  4.2  5.2  5.2      NuStep   Level  '2  4  4  6    ' SPM  70  80  90  90    Minutes  '10  10  10  10    ' METs  2.2  2.9  3.1  4.1      Track   Laps  -  -  6  7    Minutes  -  -  5  5    METs  -  -  3.09  3.43      Home Exercise Plan   Plans to continue exercise at  -  Home (comment)  Home (comment)  Home (comment)    Frequency  -  Add 2 additional days to program exercise sessions.  Add 2 additional days to program exercise sessions.  Add 2 additional days to program exercise sessions.    Initial Home Exercises Provided  -  02/08/17  02/08/17  02/08/17       Exercise Comments: Exercise Comments    Row Name 02/01/17 1455 02/03/17 1600 02/15/17 1502 03/01/17 1455 04/06/17 1534   Exercise Comments  Off to a good start with exercise.  Reviewed METs and goals with patient.  Reviewed METs and goals with patient.  Reviewed METs and goals with patient.  Patient continues to progress well with exercise.      Exercise Goals and Review: Exercise Goals    Row Name 01/28/17 0929             Exercise Goals   Increase Physical Activity  Yes       Intervention  Provide advice, education, support and counseling about physical activity/exercise needs.;Develop an individualized exercise prescription for aerobic and resistive training based on initial evaluation findings, risk stratification, comorbidities and participant's personal goals.       Expected Outcomes  Achievement of increased cardiorespiratory fitness and enhanced flexibility, muscular endurance and strength shown through measurements of functional capacity and personal statement of participant.       Increase Strength and Stamina  Yes       Intervention  Provide advice, education, support and counseling about physical activity/exercise needs.;Develop an individualized exercise prescription for aerobic  and resistive training based on initial evaluation findings, risk stratification, comorbidities and participant's personal goals.       Expected Outcomes  Achievement of increased cardiorespiratory fitness and enhanced flexibility, muscular endurance and strength shown through measurements of functional capacity and personal statement of participant.       Able to understand and use rate of perceived exertion (RPE) scale  Yes       Intervention  Provide education and explanation on how to use RPE scale       Expected Outcomes  Short Term: Able to use RPE daily in rehab to express subjective intensity level;Long Term:  Able to use RPE to guide intensity level when exercising independently       Knowledge and understanding of Target Heart Rate Range (THRR)  Yes       Intervention  Provide education and explanation of THRR including how the numbers were predicted and where they are located for reference       Expected Outcomes  Short Term: Able to state/look up THRR;Short Term: Able to use daily as guideline for intensity in rehab;Long Term: Able to use THRR to govern intensity when exercising independently       Able to check pulse independently  Yes       Intervention  Provide education and demonstration on how to check pulse in carotid and radial arteries.;Review the importance of being able to check your own pulse for safety during independent exercise       Expected Outcomes  Short Term: Able to explain why pulse checking is important during independent exercise;Long Term: Able to check pulse independently and accurately       Understanding of Exercise Prescription  Yes  Intervention  Provide education, explanation, and written materials on patient's individual exercise prescription       Expected Outcomes  Short Term: Able to explain program exercise prescription;Long Term: Able to explain home exercise prescription to exercise independently          Exercise Goals Re-Evaluation : Exercise  Goals Re-Evaluation    Row Name 02/03/17 1710 02/08/17 1530 02/10/17 1520 02/15/17 1502 03/01/17 1455     Exercise Goal Re-Evaluation   Exercise Goals Review  Increase Physical Activity;Understanding of Exercise Prescription  Understanding of Exercise Prescription;Knowledge and understanding of Target Heart Rate Range (THRR);Able to understand and use rate of perceived exertion (RPE) scale  Increase Physical Activity;Increase Strength and Stamina  Increase Physical Activity  Increase Physical Activity   Comments  Patient is walking 2 days/week in addition to exercise at [facility] and plans to join a gym  Reviewed home exercise guidelines with patient including THRR, RPE scale and endpoints for exercise.  Patient would like to be able to walk up the 2 flights of stairs to get to her job. Discussed taking it 1 flight then the elevator and progressing from there. Added walking the stairs 5 minutes in conjunction with walking TM 5 mins, also increased from 2.39mh with 0% incline on TM to 2.3 with 1% incline to help build stamina with inclines.  Patient was able to walk up 2 flights of stairs at work without symptoms. Pt states she had some anxiety but otherwise tolerated the walk well.  Patient is progressing well with exercise. Pt is walking 30 minutes 2 days/week in addition to exercise at [facility].   Expected Outcomes  Patient will continue to exercise at least 5 days/week to help achieve health and fitness goals.  Continue 30 minutes exercise, 5 days/week.  Patient will be able to comfortably walk up 1 flight of stairs at work then progress to walking 2 flights of stairs at work.  Continue to progress workloads to increase strength and  stamina to be able to participate in ADLs.  Continue exercise routine 30 minutes, 5 days/week to help achieve personal health and fitness goals.   RGraceName 04/06/17 1533             Exercise Goal Re-Evaluation   Exercise Goals Review  Increase Physical Activity       Comments   Patient continues to progress well with exercise. Pt's current MET level is 4.2       Expected Outcomes  Continue exercise routine 30 minutes, 5 days/week to help achieve personal health and fitness goals.           Discharge Exercise Prescription (Final Exercise Prescription Changes): Exercise Prescription Changes - 04/02/17 1505      Response to Exercise   Blood Pressure (Admit)  110/60    Blood Pressure (Exercise)  124/74    Blood Pressure (Exit)  121/74    Heart Rate (Admit)  57 bpm    Heart Rate (Exercise)  94 bpm    Heart Rate (Exit)  55 bpm    Rating of Perceived Exertion (Exercise)  12    Symptoms  none    Duration  Continue with 30 min of aerobic exercise without signs/symptoms of physical distress.    Intensity  THRR unchanged      Progression   Progression  Continue to progress workloads to maintain intensity without signs/symptoms of physical distress.    Average METs  4.2      Resistance Training   Training  Prescription  Yes    Weight  3lbs    Reps  10-15    Time  10 Minutes      Interval Training   Interval Training  No      Treadmill   MPH  2.6    Grade  3    Minutes  5    METs  4.07      Recumbant Bike   Level  3.5 upright scifit    Minutes  10    METs  5.2      NuStep   Level  6    SPM  90    Minutes  10    METs  4.1      Track   Laps  7    Minutes  5    METs  3.43      Home Exercise Plan   Plans to continue exercise at  Home (comment)    Frequency  Add 2 additional days to program exercise sessions.    Initial Home Exercises Provided  02/08/17       Nutrition:  Target Goals: Understanding of nutrition guidelines, daily intake of sodium <1546m, cholesterol <2066m calories 30% from fat and 7% or less from saturated fats, daily to have 5 or more servings of fruits and vegetables.  Biometrics: Pre Biometrics - 01/28/17 0948      Pre Biometrics   Height  '5\' 3"'  (1.6 m)    Weight  192 lb 3.9 oz (87.2 kg)    Waist Circumference  41.5  inches    Hip Circumference  43.5 inches    Waist to Hip Ratio  0.95 %    BMI (Calculated)  34.06    Triceps Skinfold  33 mm    % Body Fat  43.9 %    Grip Strength  33 kg    Flexibility  14 in    Single Leg Stand  20.28 seconds        Nutrition Therapy Plan and Nutrition Goals: Nutrition Therapy & Goals - 01/28/17 1054      Nutrition Therapy   Diet  Heart Healthy      Personal Nutrition Goals   Nutrition Goal  Pt to identify and limit food sources of saturated fat, trans fat, and sodium    Personal Goal #2  Pt to identify food quantities necessary to achieve weight loss of 6-24 lb (2.7-10.9 kg) at graduation from cardiac rehab.        Intervention Plan   Intervention  Prescribe, educate and counsel regarding individualized specific dietary modifications aiming towards targeted core components such as weight, hypertension, lipid management, diabetes, heart failure and other comorbidities.    Expected Outcomes  Short Term Goal: Understand basic principles of dietary content, such as calories, fat, sodium, cholesterol and nutrients.;Long Term Goal: Adherence to prescribed nutrition plan.       Nutrition Assessments: Nutrition Assessments - 01/28/17 1055      MEDFICTS Scores   Pre Score  33       Nutrition Goals Re-Evaluation:   Nutrition Goals Re-Evaluation:   Nutrition Goals Discharge (Final Nutrition Goals Re-Evaluation):   Psychosocial: Target Goals: Acknowledge presence or absence of significant depression and/or stress, maximize coping skills, provide positive support system. Participant is able to verbalize types and ability to use techniques and skills needed for reducing stress and depression.  Initial Review & Psychosocial Screening: Initial Psych Review & Screening - 01/28/17 1259      Initial Review  Current issues with  Current Anxiety/Panic;Current Stress Concerns;Current Sleep Concerns    Source of Stress Concerns  Unable to participate in former  interests or hobbies;Unable to perform yard/household activities;Chronic Illness    Comments  Pt reports feeling anxious regarding having another heart event      Family Dynamics   Good Support System?  Yes      Barriers   Psychosocial barriers to participate in program  The patient should benefit from training in stress management and relaxation.      Screening Interventions   Interventions  To provide support and resources with identified psychosocial needs;Encouraged to exercise       Quality of Life Scores: Quality of Life - 01/28/17 0949      Quality of Life Scores   Health/Function Pre  16.89 %    Socioeconomic Pre  22.21 %    Psych/Spiritual Pre  22.21 %    Family Pre  23.63 %    GLOBAL Pre  20.06 %      Scores of 19 and below usually indicate a poorer quality of life in these areas.  A difference of  2-3 points is a clinically meaningful difference.  A difference of 2-3 points in the total score of the Quality of Life Index has been associated with significant improvement in overall quality of life, self-image, physical symptoms, and general health in studies assessing change in quality of life.  PHQ-9: Recent Review Flowsheet Data    Depression screen [facility] 2/9 02/01/2017   Decreased Interest 0   Down, Depressed, Hopeless 1    PHQ - 2 Score 1     Interpretation of Total Score  Total Score Depression Severity:  1-4 = Minimal depression, 5-9 = Mild depression, 10-14 = Moderate depression, 15-19 = Moderately severe depression, 20-27 = Severe depression   Psychosocial Evaluation and Intervention: Psychosocial Evaluation - 01/28/17 1302      Psychosocial Evaluation & Interventions   Interventions  Therapist referral;Physician referral;Relaxation education;Stress management education;Encouraged to exercise with the program and follow exercise prescription    Comments  Pt to [place] with Psychologist that she has seen in the past.  Pt to ask primary MD for anxiety  medication that was discussed at the last follow up.  Appointment made with chaplain at [facility] care depart for Monday 1/4 after exercise.    Expected Outcomes  Pt will seek help when needed and establish positive and healthy coping skills to contribute in a meaningful way to her community.    Continue Psychosocial Services   Follow up required by staff       Psychosocial Re-Evaluation: Psychosocial Re-Evaluation    [place] Name 02/10/17 1703 03/11/17 1458 04/08/17 1549         Psychosocial Re-Evaluation   Current issues with  Current Anxiety/Panic  Current Anxiety/Panic  Current Anxiety/Panic     Comments  Seymone will continue to participate in cardiac rehab and meet with her therapist. Shermeka has been presribed zoloft for anxiety  Layney will continue to participate in cardiac rehab and meet with her therapist. Maycie has been presribed zoloft for anxiety  Lateya will continue to participate in cardiac rehab and meet with her therapist. Tamanna has been presribed zoloft for anxiety     Expected Outcomes  Earle will have less stress and anxiety upon completion of cardiac rehab.  Katty will have less stress and anxiety upon completion of cardiac rehab. Calisha has been feeling better  Angellica will have less stress and anxiety  upon completion of cardiac rehab. Jailey has been feeling better     Interventions  Stress management education;Physician referral;Therapist referral;Encouraged to attend Cardiac Rehabilitation for the exercise Adaysha meets with her therapist on a regular basis and saw her primary provider last week for anxiety  Stress management education;Physician referral;Therapist referral;Encouraged to attend Cardiac Rehabilitation for the exercise  Stress management education;Physician referral;Therapist referral;Encouraged to attend Cardiac Rehabilitation for the exercise     Continue Psychosocial Services   Follow up required by staff  Follow up required by staff  Follow up required  by staff        Psychosocial Discharge (Final Psychosocial Re-Evaluation): Psychosocial Re-Evaluation - 04/08/17 1549      Psychosocial Re-Evaluation   Current issues with  Current Anxiety/Panic    Comments  Niyati will continue to participate in cardiac rehab and meet with her therapist. Casie has been presribed zoloft for anxiety    Expected Outcomes  Vidalia will have less stress and anxiety upon completion of cardiac rehab. Camreigh has been feeling better    Interventions  Stress management education;Physician referral;Therapist referral;Encouraged to attend Cardiac Rehabilitation for the exercise    Continue Psychosocial Services   Follow up required by staff       Vocational Rehabilitation: Provide vocational rehab assistance to qualifying candidates.   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 Participant will state understanding/return demonstration of topics presented.  Learning Barriers/Preferences: Learning Barriers/Preferences - 01/28/17 9562      Learning Barriers/Preferences   Learning Barriers  Sight    Learning Preferences  Written Material       Education Topics: Count Your Pulse:  -Group instruction provided by verbal instruction, demonstration, patient participation and written materials to support subject.  Instructors address importance of being able to find your pulse and how to count your pulse when at home without a heart monitor.  Patients get hands on experience counting their pulse with staff help and individually.   CARDIAC REHAB PHASE II EXERCISE from 04/02/2017 in [place]  Date  02/12/17    Educator  RN      Heart Attack, Angina, and Risk Factor Modification:  -Group instruction provided by verbal instruction, video, and written materials to support subject.  Instructors address signs and symptoms of angina and heart attacks.    Also discuss risk factors for heart disease and how to make changes to improve heart health risk factors.   Functional Fitness:  -Group instruction provided by verbal instruction, demonstration, patient participation, and written materials to support subject.  Instructors address safety measures for doing things around the house.  Discuss how to get up and down off the floor, how to pick things up properly, how to safely get out of a chair without assistance, and balance training.   CARDIAC REHAB PHASE II EXERCISE from 04/02/2017 in [place]  Date  04/02/17  Instruction Review Code  2- Demonstrated Understanding      Meditation and Mindfulness:  -Group instruction provided by verbal instruction, patient participation, and written materials to support subject.  Instructor addresses importance of mindfulness and meditation practice to  help reduce stress and improve awareness.  Instructor also leads participants through a meditation exercise.    CARDIAC REHAB PHASE II EXERCISE from 04/07/2017 in [place]  Date  04/07/17  Educator  Jeanella Craze  Instruction Review Code  2- Demonstrated Understanding      Stretching for Flexibility and Mobility:  -Group instruction provided by verbal instruction, patient participation, and written materials to support subject.  Instructors lead participants through series of stretches that are designed to increase flexibility thus improving mobility.  These stretches are additional exercise for major muscle groups that are typically performed during regular warm up and cool down.   Hands Only CPR:  -Group verbal, video, and participation provides a  basic overview of AHA guidelines for community CPR. Role-play of emergencies allow participants the opportunity to practice calling for help and chest compression technique with discussion of AED use.   CARDIAC REHAB PHASE II EXERCISE from 04/02/2017 in [place]  Date  02/19/17  Instruction Review Code  2- Demonstrated Understanding      Hypertension: -Group verbal and written instruction that provides a basic overview of hypertension including the most recent diagnostic guidelines, risk factor reduction with self-care instructions and medication management.    Nutrition I class: Heart Healthy Eating:  -Group instruction provided by PowerPoint slides, verbal discussion, and written materials to support subject matter. The instructor gives an explanation and review of the Therapeutic Lifestyle Changes diet recommendations, which includes a discussion on lipid goals, dietary fat, sodium, fiber, plant stanol/sterol esters, sugar, and the components of a well-balanced, healthy diet.   CARDIAC REHAB PHASE II EXERCISE from 04/02/2017 in [place]  Date  01/28/17  Educator  RD      Nutrition II class: Lifestyle Skills:  -Group instruction provided by PowerPoint slides, verbal discussion, and written materials to support subject matter. The instructor gives an explanation and review of label reading, grocery shopping for heart health, heart healthy recipe modifications, and ways to make healthier choices when eating out.   CARDIAC REHAB PHASE II EXERCISE from 04/02/2017 in [place]  Date  01/28/17  Educator  RD      Diabetes Question & Answer:  -Group instruction provided by PowerPoint slides, verbal discussion, and written materials to support subject matter. The instructor gives an explanation and review of diabetes co-morbidities, pre- and post-prandial blood glucose goals, pre-exercise blood  glucose goals, signs, symptoms, and treatment of hypoglycemia and hyperglycemia, and foot care basics.   CARDIAC REHAB PHASE II EXERCISE from 04/02/2017 in [place]  Date  03/12/17  Educator  RD      Diabetes Blitz:  -Group instruction provided by PowerPoint slides, verbal discussion, and written materials to support subject matter. The instructor gives an explanation and review of the physiology behind type 1 and type 2 diabetes, diabetes medications and rational behind using different medications, pre- and post-prandial blood glucose recommendations and Hemoglobin A1c goals, diabetes diet, and exercise including blood glucose guidelines for exercising safely.    Portion Distortion:  -Group instruction provided by PowerPoint slides, verbal discussion, written materials, and food models to support subject matter. The instructor gives an explanation of serving size versus portion size, changes in portions sizes over the last 20 years, and what consists of a serving from each food group.   CARDIAC REHAB PHASE II EXERCISE from 04/02/2017 in [place]  Date  02/17/17  Educator  RD  Instruction Review Code  2- Demonstrated Understanding      Stress Management:  -Group instruction provided by verbal instruction, video, and written materials to support subject matter.  Instructors review role of stress in heart disease and how to cope with stress positively.     CARDIAC REHAB PHASE II EXERCISE from 04/02/2017 in [place]  Date  02/24/17  Instruction Review Code  2- Demonstrated Understanding      Exercising on Your Own:  -Group instruction provided by verbal instruction, power point, and written materials to support subject.  Instructors discuss benefits of exercise, components of exercise, frequency and intensity of exercise, and end points for exercise.  Also discuss use of nitroglycerin and  activating EMS.  Review options of places to exercise outside of rehab.  Review guidelines for sex with heart disease.   CARDIAC REHAB PHASE II EXERCISE from 04/02/2017 in [place]  Date  03/24/17  Educator  EP  Instruction Review Code  2- Demonstrated Understanding      Cardiac Drugs I:  -Group instruction provided by verbal instruction and written materials to support subject.  Instructor reviews cardiac drug classes: antiplatelets, anticoagulants, beta blockers, and statins.  Instructor discusses reasons, side effects, and lifestyle considerations for each drug class.   CARDIAC REHAB PHASE II EXERCISE from 04/02/2017 in [place]  Date  03/31/17  Educator  -- [pharmacy]  Instruction Review Code  2- Demonstrated Understanding      Cardiac Drugs II:  -Group instruction provided by verbal instruction and written materials to support subject.  Instructor reviews cardiac drug classes: angiotensin converting enzyme inhibitors (ACE-I), angiotensin II receptor blockers (ARBs), nitrates, and calcium channel blockers.  Instructor discusses reasons, side effects, and lifestyle considerations for each drug class.   CARDIAC REHAB PHASE II EXERCISE from 04/02/2017 in [place]  Date  03/03/17  Instruction Review Code  2- Demonstrated Understanding      Anatomy and Physiology of the Circulatory System:  Group verbal and written instruction and models provide basic cardiac anatomy and physiology, with the coronary electrical and arterial systems. Review of: AMI, Angina, Valve disease, Heart Failure, Peripheral Artery Disease, Cardiac Arrhythmia, Pacemakers, and the ICD.   CARDIAC REHAB PHASE II EXERCISE from 04/02/2017 in [place]  Date  03/10/17  Instruction Review Code  2- Demonstrated Understanding      Other Education:  -Group or individual verbal, written, or  video instructions that support the educational goals of the cardiac rehab program.   Holiday Eating Survival Tips:  -Group instruction provided by PowerPoint slides, verbal discussion, and written materials to support subject matter. The instructor gives patients tips, tricks, and techniques to help them not only survive but enjoy the holidays despite the onslaught of food that accompanies the holidays.   Knowledge Questionnaire Score: Knowledge Questionnaire Score - 01/28/17 0849      Knowledge Questionnaire Score   Pre Score  20/24       Core Components/Risk Factors/Patient Goals at Admission: Personal Goals and Risk Factors at Admission - 01/28/17 1211      Core Components/Risk Factors/Patient Goals on Admission    Weight Management  Yes;Obesity;Weight Maintenance;Weight Loss    Intervention  Weight Management: Develop a combined nutrition and exercise program designed to reach desired caloric intake, while maintaining appropriate intake of nutrient and fiber, sodium and fats,  and appropriate energy expenditure required for the weight goal.;Weight Management: Provide education and appropriate resources to help participant work on and attain dietary goals.;Weight Management/Obesity: Establish reasonable short term and long term weight goals.;Obesity: Provide education and appropriate resources to help participant work on and attain dietary goals.    Admit Weight  192 lb 3.9 oz (87.2 kg)    Goal Weight: Short Term  185 lb (83.9 kg)    Goal Weight: Long Term  155 lb (70.3 kg)    Expected Outcomes  Short Term: Continue to assess and modify interventions until short term weight is achieved;Long Term: Adherence to nutrition and physical activity/exercise program aimed toward attainment of established weight goal;Weight Maintenance: Understanding of the daily nutrition guidelines, which includes 25-35% calories from fat, 7% or less cal from saturated fats, less than 221m cholesterol, less than  1.5gm of sodium, & 5 or more servings of fruits and vegetables daily;Weight Loss: Understanding of general recommendations for a balanced deficit meal plan, which promotes 1-2 lb weight loss per week and includes a negative energy balance of [id number] kcal/d;Understanding recommendations for meals to include 15-35% energy as protein, 25-35% energy from fat, 35-60% energy from carbohydrates, less than 2028mof dietary cholesterol, 20-35 gm of total fiber daily;Understanding of distribution of calorie intake throughout the day with the consumption of 4-5 meals/snacks    Hypertension  Yes    Intervention  Provide education on lifestyle modifcations including regular physical activity/exercise, weight management, moderate sodium restriction and increased consumption of fresh fruit, vegetables, and low fat dairy, alcohol moderation, and smoking cessation.;Monitor prescription use compliance.    Expected Outcomes  Short Term: Continued assessment and intervention until BP is < 140/9025mG in hypertensive participants. < 130/35m4m in hypertensive participants with diabetes, heart failure or chronic kidney disease.;Long Term: Maintenance of blood pressure at goal levels.    Lipids  Yes    Intervention  Provide education and support for participant on nutrition & aerobic/resistive exercise along with prescribed medications to achieve LDL <70mg62mL >40mg.30mExpected Outcomes  Short Term: Participant states understanding of desired cholesterol values and is compliant with medications prescribed. Participant is following exercise prescription and nutrition guidelines.;Long Term: Cholesterol controlled with medications as prescribed, with individualized exercise RX and with personalized nutrition plan. Value goals: LDL < 70mg, 54m> 40 mg.    Stress  Yes    Intervention  Offer individual and/or small group education and counseling on adjustment to heart disease, stress management and health-related lifestyle change.  Teach and support self-help strategies.;Refer participants experiencing significant psychosocial distress to appropriate mental health specialists for further evaluation and treatment. When possible, include family members and significant others in education/counseling sessions.    Expected Outcomes  Short Term: Participant demonstrates changes in health-related behavior, relaxation and other stress management skills, ability to obtain effective social support, and compliance with psychotropic medications if prescribed.;Long Term: Emotional wellbeing is indicated by absence of clinically significant psychosocial distress or social isolation.    Personal Goal Other  Yes    Personal Goal  Decrease fear avoidance and anxiety about recent cardiac event. Build confidence and learn about exercise/activity limitations    Intervention  Provide counseling on stress management and coping mechanisms. Further discuss activity restrictions and exercise programming to build on confidence with activity.     Expected Outcomes  Pt will have increase confidence and motivation for physical actvity/exercise and decrease stress and worries about cardiac event. More importantly, pt will have a  better understanding of activity limitations and reduce modifiable risk factors.        Core Components/Risk Factors/Patient Goals Review:  Goals and Risk Factor Review    Row Name 02/10/17 1659 03/11/17 1455 04/08/17 1549         Core Components/Risk Factors/Patient Goals Review   Personal Goals Review  Weight Management/Obesity;Lipids;Hypertension;Stress  Weight Management/Obesity;Lipids;Hypertension;Stress  Weight Management/Obesity;Lipids;Hypertension;Stress     Review  Baylea'a vital signs have bee stable. Kama is off to a good start ton exercise  Nashya'a vital signs have bee stable. Celicia is doing well with exercise and has not reported any recent complaints of chest pain  Shenia'a vital signs have bee stable. Clea is  doing well with exercise and has not reported any recent complaints of chest pain     Expected Outcomes  Kiira will continue to take her medications for HTN, Hyperlipidemia and follow a heart healthy diabetic diet.  Murna will continue to take her medications for HTN, Hyperlipidemia and follow a heart healthy diabetic diet.  Leira will continue to take her medications for HTN, Hyperlipidemia and follow a heart healthy diabetic diet.        Core Components/Risk Factors/Patient Goals at Discharge (Final Review):  Goals and Risk Factor Review - 04/08/17 1549      Core Components/Risk Factors/Patient Goals Review   Personal Goals Review  Weight Management/Obesity;Lipids;Hypertension;Stress    Review  Gaetana'a vital signs have bee stable. Windie is doing well with exercise and has not reported any recent complaints of chest pain    Expected Outcomes  Anastasija will continue to take her medications for HTN, Hyperlipidemia and follow a heart healthy diabetic diet.       ITP Comments: ITP Comments    Row Name 01/28/17 0926 02/10/17 1655 03/11/17 1449 04/08/17 1549     ITP Comments  Dr. Fransico Him, Medical Director  30 day ITP review. Britteny is off to a good start to exercise. Will continue to monitor for signs of chest pain and give emotional support for anxiety  30 day ITP review. Patient with good attendance and participation in phase 2 cardiac rehab. Will continue to give positive feedback to the patient  30 day ITP review. Patient with good attendance and participation in phase 2 cardiac rehab. Will continue to give positive feedback to the patient       Comments: See ITP comments.Barnet Pall, RN,BSN 04/08/2017 3:53 PM

## 2017-04-09 ENCOUNTER — Encounter (HOSPITAL_COMMUNITY)
Admission: RE | Admit: 2017-04-09 | Discharge: 2017-04-09 | Disposition: A | Discharge status: Home / Self Care | Payer: BC Managed Care – PPO | Source: Ambulatory Visit | Attending: Cardiology | Admitting: Cardiology

## 2017-04-09 VITALS — BP 105/80 | HR 74

## 2017-04-09 DIAGNOSIS — Z955 Presence of coronary angioplasty implant and graft: Secondary | ICD-10-CM

## 2017-04-09 DIAGNOSIS — I214 Non-ST elevation (NSTEMI) myocardial infarction: Secondary | ICD-10-CM

## 2017-04-09 DIAGNOSIS — Z48812 Encounter for surgical aftercare following surgery on the circulatory system: Secondary | ICD-10-CM | POA: Diagnosis not present

## 2017-04-12 ENCOUNTER — Encounter (HOSPITAL_COMMUNITY)
Admission: RE | Admit: 2017-04-12 | Discharge: 2017-04-12 | Disposition: A | Discharge status: Home / Self Care | Payer: BC Managed Care – PPO | Source: Ambulatory Visit | Attending: Cardiology | Admitting: Cardiology

## 2017-04-12 DIAGNOSIS — Z955 Presence of coronary angioplasty implant and graft: Secondary | ICD-10-CM

## 2017-04-12 DIAGNOSIS — I214 Non-ST elevation (NSTEMI) myocardial infarction: Secondary | ICD-10-CM

## 2017-04-12 DIAGNOSIS — Z48812 Encounter for surgical aftercare following surgery on the circulatory system: Secondary | ICD-10-CM | POA: Diagnosis not present

## 2017-04-14 ENCOUNTER — Encounter (HOSPITAL_COMMUNITY)
Admission: RE | Admit: 2017-04-14 | Discharge: 2017-04-14 | Disposition: A | Discharge status: Home / Self Care | Payer: BC Managed Care – PPO | Source: Ambulatory Visit | Attending: Cardiology | Admitting: Cardiology

## 2017-04-14 DIAGNOSIS — Z955 Presence of coronary angioplasty implant and graft: Secondary | ICD-10-CM

## 2017-04-14 DIAGNOSIS — I214 Non-ST elevation (NSTEMI) myocardial infarction: Secondary | ICD-10-CM

## 2017-04-14 NOTE — Progress Notes (Signed)
Incomplete Session Note  Patient Details  Name: Kristine Newman MRN: [id number] Date of Birth: 01-20-1975 Referring Provider:     [facility]ARD[facility]  Referring Provider  Truett MainlandPatwardhan, Manish MD      Kristine RandIyanna Newman did not complete her rehab session.  Patient complained of feeling lightheaded this afternoon. Blood pressure 94/70. Heart rate 53. Sinus Huston FoleyBrady. Patient given Gatorade. Recheck blood pressure 100/60 sitting. Standing blood pressure 96/64. Kristine Newman reported feeling better after drinking Gatorade but did not exercise due to low BP. Dr Brennan BaileyPatwrdhan's office called and notified. I spoke with [name]. Dr Brennan BaileyPatwrdhan's office will call Kristine Newman with further instructions. Jakyrah left cardiac rehab without complaints or symptoms upon exit from cardiac rehab.Will fax exercise flow sheets to Dr. Damian LeavellPatwardhan's office for [place] PedreviewMaria Jaydon Soroka, RN,BSN 04/14/2017 3:52 PM

## 2017-04-16 ENCOUNTER — Encounter (HOSPITAL_COMMUNITY): Payer: BC Managed Care – PPO

## 2017-04-19 ENCOUNTER — Encounter (HOSPITAL_COMMUNITY)
Admission: RE | Admit: 2017-04-19 | Discharge: 2017-04-19 | Disposition: A | Discharge status: Home / Self Care | Payer: BC Managed Care – PPO | Source: Ambulatory Visit | Attending: Cardiology | Admitting: Cardiology

## 2017-04-19 DIAGNOSIS — Z955 Presence of coronary angioplasty implant and graft: Secondary | ICD-10-CM | POA: Diagnosis present

## 2017-04-19 DIAGNOSIS — R001 Bradycardia, unspecified: Secondary | ICD-10-CM | POA: Diagnosis not present

## 2017-04-19 DIAGNOSIS — Z48812 Encounter for surgical aftercare following surgery on the circulatory system: Secondary | ICD-10-CM | POA: Diagnosis not present

## 2017-04-19 DIAGNOSIS — I214 Non-ST elevation (NSTEMI) myocardial infarction: Secondary | ICD-10-CM | POA: Insufficient documentation

## 2017-04-19 DIAGNOSIS — R079 Chest pain, unspecified: Secondary | ICD-10-CM | POA: Insufficient documentation

## 2017-04-21 ENCOUNTER — Encounter (HOSPITAL_COMMUNITY)
Admission: RE | Admit: 2017-04-21 | Discharge: 2017-04-21 | Disposition: A | Discharge status: Home / Self Care | Payer: BC Managed Care – PPO | Source: Ambulatory Visit | Attending: Cardiology | Admitting: Cardiology

## 2017-04-21 DIAGNOSIS — Z48812 Encounter for surgical aftercare following surgery on the circulatory system: Secondary | ICD-10-CM | POA: Diagnosis not present

## 2017-04-21 DIAGNOSIS — Z955 Presence of coronary angioplasty implant and graft: Secondary | ICD-10-CM

## 2017-04-21 DIAGNOSIS — I214 Non-ST elevation (NSTEMI) myocardial infarction: Secondary | ICD-10-CM

## 2017-04-23 ENCOUNTER — Encounter (HOSPITAL_COMMUNITY)
Admission: RE | Admit: 2017-04-23 | Discharge: 2017-04-23 | Disposition: A | Discharge status: Home / Self Care | Payer: BC Managed Care – PPO | Source: Ambulatory Visit | Attending: Cardiology | Admitting: Cardiology

## 2017-04-23 DIAGNOSIS — Z48812 Encounter for surgical aftercare following surgery on the circulatory system: Secondary | ICD-10-CM | POA: Diagnosis not present

## 2017-04-23 DIAGNOSIS — I214 Non-ST elevation (NSTEMI) myocardial infarction: Secondary | ICD-10-CM

## 2017-04-23 DIAGNOSIS — Z955 Presence of coronary angioplasty implant and graft: Secondary | ICD-10-CM

## 2017-04-26 ENCOUNTER — Encounter (HOSPITAL_COMMUNITY)
Admission: RE | Admit: 2017-04-26 | Discharge: 2017-04-26 | Disposition: A | Discharge status: Home / Self Care | Payer: BC Managed Care – PPO | Source: Ambulatory Visit | Attending: Cardiology | Admitting: Cardiology

## 2017-04-26 DIAGNOSIS — I214 Non-ST elevation (NSTEMI) myocardial infarction: Secondary | ICD-10-CM

## 2017-04-26 DIAGNOSIS — Z48812 Encounter for surgical aftercare following surgery on the circulatory system: Secondary | ICD-10-CM | POA: Diagnosis not present

## 2017-04-26 DIAGNOSIS — Z955 Presence of coronary angioplasty implant and graft: Secondary | ICD-10-CM

## 2017-04-28 ENCOUNTER — Encounter (HOSPITAL_COMMUNITY)
Admission: RE | Admit: 2017-04-28 | Discharge: 2017-04-28 | Disposition: A | Discharge status: Home / Self Care | Payer: BC Managed Care – PPO | Source: Ambulatory Visit | Attending: Cardiology | Admitting: Cardiology

## 2017-04-28 DIAGNOSIS — I214 Non-ST elevation (NSTEMI) myocardial infarction: Secondary | ICD-10-CM

## 2017-04-28 DIAGNOSIS — Z48812 Encounter for surgical aftercare following surgery on the circulatory system: Secondary | ICD-10-CM | POA: Diagnosis not present

## 2017-04-28 DIAGNOSIS — Z955 Presence of coronary angioplasty implant and graft: Secondary | ICD-10-CM

## 2017-05-03 ENCOUNTER — Encounter (HOSPITAL_COMMUNITY)
Admission: RE | Admit: 2017-05-03 | Discharge: 2017-05-03 | Disposition: A | Discharge status: Home / Self Care | Payer: BC Managed Care – PPO | Source: Ambulatory Visit | Attending: Cardiology | Admitting: Cardiology

## 2017-05-03 DIAGNOSIS — I214 Non-ST elevation (NSTEMI) myocardial infarction: Secondary | ICD-10-CM

## 2017-05-03 DIAGNOSIS — Z48812 Encounter for surgical aftercare following surgery on the circulatory system: Secondary | ICD-10-CM | POA: Diagnosis not present

## 2017-05-03 DIAGNOSIS — Z955 Presence of coronary angioplasty implant and graft: Secondary | ICD-10-CM

## 2017-05-05 ENCOUNTER — Encounter (HOSPITAL_COMMUNITY)
Admission: RE | Admit: 2017-05-05 | Discharge: 2017-05-05 | Disposition: A | Discharge status: Home / Self Care | Payer: BC Managed Care – PPO | Source: Ambulatory Visit | Attending: Cardiology | Admitting: Cardiology

## 2017-05-05 DIAGNOSIS — I214 Non-ST elevation (NSTEMI) myocardial infarction: Secondary | ICD-10-CM

## 2017-05-05 DIAGNOSIS — Z48812 Encounter for surgical aftercare following surgery on the circulatory system: Secondary | ICD-10-CM | POA: Diagnosis not present

## 2017-05-05 DIAGNOSIS — Z955 Presence of coronary angioplasty implant and graft: Secondary | ICD-10-CM

## 2017-05-06 NOTE — Progress Notes (Signed)
Cardiac Individual Treatment Plan  Patient Details  Name: Kristine Newman MRN: [id number] Date of Birth: Feb 02, 1975 Referring Provider:     [place] from 01/28/2017 in [place]  Referring Provider  Vernell Leep MD      Initial Encounter Date:    CARDIAC REHAB PHASE II ORIENTATION from 01/28/2017 in [place]  Date  01/28/17  Referring Provider  Vernell Leep MD      Visit Diagnosis: NSTEMI (non-ST elevated myocardial infarction) [facility])  Status post coronary artery stent placement  Patient's Home Medications on Admission:  Current Outpatient Medications:  .  acetaminophen (TYLENOL) 500 MG tablet, Take 500 mg every 6 (six) hours as needed by mouth for mild pain., Disp: , Rfl:  .  aspirin (ASPIRIN CHILDRENS) 81 MG chewable tablet, Chew 1 tablet (81 mg total) by mouth daily., Disp: 90 tablet, Rfl: 3 .  atorvastatin (LIPITOR) 80 MG tablet, Take 1 tablet (80 mg total) by mouth daily at 6 PM., Disp: 90 tablet, Rfl: 3 .  clopidogrel (PLAVIX) 75 MG tablet, Take 75 mg by mouth daily., Disp: , Rfl:  .  fluticasone (FLONASE) 50 MCG/ACT nasal spray, Place 2 sprays daily as needed into both nostrils for allergies. , Disp: , Rfl:  .  guaiFENesin (MUCINEX) 600 MG 12 hr tablet, Take 600 mg by mouth 2 (two) times daily as needed for to loosen phlegm., Disp: , Rfl:  .  ipratropium (ATROVENT) 0.06 % nasal spray, Place 2 sprays into the nose daily as needed for rhinitis. , Disp: , Rfl: 5 .  lisinopril (PRINIVIL,ZESTRIL) 10 MG tablet, Take 1 tablet (10 mg total) by mouth daily. (Patient taking differently: Take 20 mg by mouth daily. ), Disp: 90 tablet, Rfl: 3 .  metoprolol tartrate (LOPRESSOR) 50 MG tablet, Take 1 tablet (50 mg total) by mouth 2 (two) times daily. (Patient taking differently: Take 25 mg by mouth 2 (two) times daily. ), Disp: 60 tablet, Rfl: 3 .  nitroGLYCERIN (NITROSTAT) 0.4 MG SL tablet,  Place 1 tablet (0.4 mg total) under the tongue every 5 (five) minutes as needed for chest pain., Disp: 30 tablet, Rfl: 3 .  pantoprazole (PROTONIX) 40 MG tablet, Take 40 mg daily by mouth., Disp: , Rfl:  .  PROAIR HFA 108 (90 Base) MCG/ACT inhaler, Take 2 puffs by mouth every 4 (four) hours as needed for wheezing or shortness of breath., Disp: , Rfl: 0 .  sertraline (ZOLOFT) 50 MG tablet, Take 50 mg by mouth daily., Disp: , Rfl:  .  Vitamin D, Ergocalciferol, (DRISDOL) 50000 units CAPS capsule, Take 50,000 Units by mouth every Tuesday. , Disp: , Rfl: 0  Past Medical History: Past Medical History:  Diagnosis Date  . Anxiety   . High cholesterol   . Hypertension   . NSTEMI (non-ST elevated myocardial infarction) ([place]) 11/15/2016  . Pre-diabetes     Tobacco Use: Social History   Tobacco Use  Smoking Status Never Smoker  Smokeless Tobacco Never Used    Labs: Recent Review Scientist, physiological    Labs for ITP Cardiac and Pulmonary Rehab Latest Ref Rng & Units 11/16/2016   Cholestrol 0 - 200 mg/dL 275(H)   LDLCALC 0 - 99 mg/dL 190(H)   HDL >40 mg/dL 45   Trlycerides <150 mg/dL 199(H)   Hemoglobin A1c 4.8 - 5.6 % 6.3(H)      Capillary Blood Glucose: Lab Results  Component Value Date   GLUCAP 98 02/03/2017  GLUCAP 99 02/01/2017   GLUCAP 88 02/01/2017   GLUCAP 85 11/20/2016   GLUCAP 78 11/19/2016     Exercise Target Goals:    Exercise Program Goal: Individual exercise prescription set using results from initial 6 min walk test and THRR while considering  patient's activity barriers and safety.   Exercise Prescription Goal: Initial exercise prescription builds to 30-45 minutes a day of aerobic activity, 2-3 days per week.  Home exercise guidelines will be given to patient during program as part of exercise prescription that the participant will acknowledge.  Activity Barriers & Risk Stratification: Activity Barriers & Cardiac Risk Stratification - 01/28/17 1208       Activity Barriers & Cardiac Risk Stratification   Activity Barriers  Deconditioning;Muscular Weakness;Shortness of Breath;Chest Pain/Angina    Cardiac Risk Stratification  High       6 Minute Walk: 6 Minute Walk    Row Name 01/28/17 0928 01/28/17 0947 01/28/17 1208     6 Minute Walk   Phase  Initial  -  -   Distance  1210 feet  -  -   Walk Time  6 minutes  -  -   # of Rest Breaks  0  -  -   MPH  -  2.3  -   METS  -  3.6  -   RPE  11  -  -   Perceived Dyspnea   1  -  -   VO2 Peak  -  12.45  -   Symptoms  Yes (comment)  -  -   Comments  SOB   -  -   Resting HR  -  55 bpm  -   Resting BP  112/64  112/64  -   Resting Oxygen Saturation   98 %  -  -   Exercise Oxygen Saturation  during 6 min walk  99 %  -  -   Max Ex. HR  -  83 bpm  -   Max Ex. BP  118/62  -  -   2 Minute Post BP  -  -  110/68   Row Name 05/03/17 1501         6 Minute Walk   Phase  Discharge     Distance  1636 feet     Distance % Change  35.2 %     Distance Feet Change  426 ft     Walk Time  6 minutes     # of Rest Breaks  0     MPH  3.1     METS  4.41     RPE  10     Perceived Dyspnea   0     VO2 Peak  15.44     Symptoms  No     Resting HR  56 bpm     Resting BP  98/60     Max Ex. HR  84 bpm     Max Ex. BP  122/80     2 Minute Post BP  107/71        Oxygen Initial Assessment:   Oxygen Re-Evaluation:   Oxygen Discharge (Final Oxygen Re-Evaluation):   Initial Exercise Prescription: Initial Exercise Prescription - 01/28/17 1200      Date of Initial Exercise RX and Referring Provider   Date  01/28/17    Referring Provider  Vernell Leep MD      Treadmill   MPH  2.3    Grade  0    Minutes  10    METs  2.76      Recumbant Bike   Level  1.5 upright scifit    Minutes  10    METs  2      NuStep   Level  2    SPM  70    Minutes  10    METs  2      Prescription Details   Frequency (times per week)  3    Duration  Progress to 30 minutes of continuous aerobic without  signs/symptoms of physical distress      Intensity   THRR 40-80% of Max Heartrate  71-142    Ratings of Perceived Exertion  11-15    Perceived Dyspnea  0-4      Progression   Progression  Continue to progress workloads to maintain intensity without signs/symptoms of physical distress.      Resistance Training   Training Prescription  Yes    Weight  2lbs    Reps  10-15       Perform Capillary Blood Glucose checks as needed.  Exercise Prescription Changes: Exercise Prescription Changes    Row Name 02/01/17 1455 02/15/17 1450 03/01/17 1455 04/02/17 1505 04/12/17 1502     Response to Exercise   Blood Pressure (Admit)  132/84  98/67  106/78  110/60  110/64   Blood Pressure (Exercise)  134/72  107/61  120/80  124/74  120/66   Blood Pressure (Exit)  100/68  103/72  100/62  121/74  98/64   Heart Rate (Admit)  74 bpm  65 bpm  61 bpm  57 bpm  90 bpm   Heart Rate (Exercise)  94 bpm  97 bpm  94 bpm  94 bpm  99 bpm   Heart Rate (Exit)  63 bpm  65 bpm  59 bpm  55 bpm  71 bpm   Rating of Perceived Exertion (Exercise)  _0 Symptoms  none  none  none  none  none   Duration  Progress to 30 minutes of  aerobic without signs/symptoms of physical distress  Continue with 30 min of aerobic exercise without signs/symptoms of physical distress.  Continue with 30 min of aerobic exercise without signs/symptoms of physical distress.  Continue with 30 min of aerobic exercise without signs/symptoms of physical distress.  Continue with 30 min of aerobic exercise without signs/symptoms of physical distress.   Intensity  THRR unchanged  THRR unchanged  THRR unchanged  THRR unchanged  THRR unchanged     Progression   Progression  Continue to progress workloads to maintain intensity without signs/symptoms of physical distress.  Continue to progress workloads to maintain intensity without signs/symptoms of physical distress.  Continue to progress workloads to maintain intensity without  signs/symptoms of physical distress.  Continue to progress workloads to maintain intensity without signs/symptoms of physical distress.  Continue to progress workloads to maintain intensity without signs/symptoms of physical distress.   Average METs  2.5  3.4  3.7  4.2  4     Resistance Training   Training Prescription  Yes  Yes  Yes  Yes  Yes   Weight  2lbs  3lbs  3lbs  3lbs  3lbs   Reps  10-15  10-15  10-15  10-15  10-15   Time  10 Minutes  10 Minutes  10 Minutes  10 Minutes  10 Minutes     Interval Training  Interval Training  No  No  No  No  No     Treadmill   MPH  2.3  2.3  2.5  2.6  2.6   Grade  0  _0 Minutes  _1 minutes TM walking, 5 minutes track walking with stairs.  _2 METs  2.76  3.08  3.6  4.07  4.07     Recumbant Bike   Level  - upright scifit  2.5 upright scifit  3 upright scifit  3.5 upright scifit  3.5 upright scifit   Minutes  -  _3 METs  -  4.2  5.2  5.2  5.1     NuStep   Level  _4 SPM  70  80  90  90  90   Minutes  _5 METs  2.2  2.9  3.1  4.1  3.5     Track   Laps  -  -  _6 Minutes  -  -  _7 METs  -  -  3.09  3.43  3.43     Home Exercise Plan   Plans to continue exercise at  -  Home (comment)  Home (comment)  Home (comment)  Home (comment)   Frequency  -  Add 2 additional days to program exercise sessions.  Add 2 additional days to program exercise sessions.  Add 2 additional days to program exercise sessions.  Add 2 additional days to program exercise sessions.   Initial Home Exercises Provided  -  02/08/17  02/08/17  02/08/17  02/08/17   Row Name 04/26/17 1449             Response to Exercise   Blood Pressure (Admit)  98/62       Blood Pressure (Exercise)  132/80       Blood Pressure (Exit)  102/68       Heart Rate (Admit)  63 bpm       Heart Rate (Exercise)  98 bpm       Heart Rate (Exit)  63 bpm       Rating of Perceived Exertion (Exercise)  11       Symptoms   none       Duration  Continue with 30 min of aerobic exercise without signs/symptoms of physical distress.       Intensity  THRR unchanged         Progression   Progression  Continue to progress workloads to maintain intensity without signs/symptoms of physical distress.       Average METs  4         Resistance Training   Training Prescription  Yes       Weight  3lbs       Reps  10-15       Time  10 Minutes         Interval Training   Interval Training  No         Treadmill   MPH  2.6       Grade  3       Minutes  5       METs  4.07  Recumbant Bike   Level  3.5 upright scifit       Minutes  10       METs  5.1         NuStep   Level  6       SPM  90       Minutes  10       METs  3.5         Track   Laps  7       Minutes  5       METs  3.43         Home Exercise Plan   Plans to continue exercise at  Home (comment)       Frequency  Add 2 additional days to program exercise sessions.       Initial Home Exercises Provided  02/08/17          Exercise Comments: Exercise Comments    Row Name 02/01/17 1455 02/03/17 1600 02/15/17 1502 03/01/17 1455 04/06/17 1534   Exercise Comments  Off to a good start with exercise.  Reviewed METs and goals with patient.  Reviewed METs and goals with patient.  Reviewed METs and goals with patient.  Patient continues to progress well with exercise.   [place] Name 04/12/17 1502 04/26/17 1449         Exercise Comments  Reviewed METs with patient.  Reviewed METs and goals with patient.         Exercise Goals and Review: Exercise Goals    Row Name 01/28/17 0929             Exercise Goals   Increase Physical Activity  Yes       Intervention  Provide advice, education, support and counseling about physical activity/exercise needs.;Develop an individualized exercise prescription for aerobic and resistive training based on initial evaluation findings, risk stratification, comorbidities and participant's personal goals.        Expected Outcomes  Achievement of increased cardiorespiratory fitness and enhanced flexibility, muscular endurance and strength shown through measurements of functional capacity and personal statement of participant.       Increase Strength and Stamina  Yes       Intervention  Provide advice, education, support and counseling about physical activity/exercise needs.;Develop an individualized exercise prescription for aerobic and resistive training based on initial evaluation findings, risk stratification, comorbidities and participant's personal goals.       Expected Outcomes  Achievement of increased cardiorespiratory fitness and enhanced flexibility, muscular endurance and strength shown through measurements of functional capacity and personal statement of participant.       Able to understand and use rate of perceived exertion (RPE) scale  Yes       Intervention  Provide education and explanation on how to use RPE scale       Expected Outcomes  Short Term: Able to use RPE daily in rehab to express subjective intensity level;Long Term:  Able to use RPE to guide intensity level when exercising independently       Knowledge and understanding of Target Heart Rate Range (THRR)  Yes       Intervention  Provide education and explanation of THRR including how the numbers were predicted and where they are located for reference       Expected Outcomes  Short Term: Able to state/look up THRR;Short Term: Able to use daily as guideline for intensity in rehab;Long Term: Able to use THRR to govern intensity when exercising independently  Able to check pulse independently  Yes       Intervention  Provide education and demonstration on how to check pulse in carotid and radial arteries.;Review the importance of being able to check your own pulse for safety during independent exercise       Expected Outcomes  Short Term: Able to explain why pulse checking is important during independent exercise;Long Term: Able to  check pulse independently and accurately       Understanding of Exercise Prescription  Yes       Intervention  Provide education, explanation, and written materials on patient's individual exercise prescription       Expected Outcomes  Short Term: Able to explain program exercise prescription;Long Term: Able to explain home exercise prescription to exercise independently          Exercise Goals Re-Evaluation : Exercise Goals Re-Evaluation    Row Name 02/03/17 1710 02/08/17 1530 02/10/17 1520 02/15/17 1502 03/01/17 1455     Exercise Goal Re-Evaluation   Exercise Goals Review  Increase Physical Activity;Understanding of Exercise Prescription  Understanding of Exercise Prescription;Knowledge and understanding of Target Heart Rate Range (THRR);Able to understand and use rate of perceived exertion (RPE) scale  Increase Physical Activity;Increase Strength and Stamina  Increase Physical Activity  Increase Physical Activity   Comments  Patient is walking 2 days/week in addition to exercise at [facility] and plans to join a gym  Reviewed home exercise guidelines with patient including THRR, RPE scale and endpoints for exercise.  Patient would like to be able to walk up the 2 flights of stairs to get to her job. Discussed taking it 1 flight then the elevator and progressing from there. Added walking the stairs 5 minutes in conjunction with walking TM 5 mins, also increased from 2.11mh with 0% incline on TM to 2.3 with 1% incline to help build stamina with inclines.  Patient was able to walk up 2 flights of stairs at work without symptoms. Pt states she had some anxiety but otherwise tolerated the walk well.  Patient is progressing well with exercise. Pt is walking 30 minutes 2 days/week in addition to exercise at [facility].   Expected Outcomes  Patient will continue to exercise at least 5 days/week to help achieve health and fitness goals.  Continue 30 minutes exercise, 5 days/week.  Patient will be able to comfortably walk up  1 flight of stairs at work then progress to walking 2 flights of stairs at work.  Continue to progress workloads to increase strength and  stamina to be able to participate in ADLs.  Continue exercise routine 30 minutes, 5 days/week to help achieve personal health and fitness goals.   [place] 04/06/17 1533 04/26/17 1449           Exercise Goal Re-Evaluation   Exercise Goals Review  Increase Physical Activity  Increase Physical Activity      Comments  Patient continues to progress well with exercise. Pt's current MET level is 4.2  Patient is walking or watching aerobics videos 30 minutes, 5 days/week. Pt is considering enrolling in the maintenance program when she completes the phase 2 CR program.      Expected Outcomes  Continue exercise routine 30 minutes, 5 days/week to help achieve personal health and fitness goals.  Patient will maintain exercise routine walking and aerobics to continue to progress toward health and fitness goals.          Discharge Exercise Prescription (Final Exercise Prescription Changes): Exercise Prescription Changes -  04/26/17 1449      Response to Exercise   Blood Pressure (Admit)  98/62    Blood Pressure (Exercise)  132/80    Blood Pressure (Exit)  102/68    Heart Rate (Admit)  63 bpm    Heart Rate (Exercise)  98 bpm    Heart Rate (Exit)  63 bpm    Rating of Perceived Exertion (Exercise)  11    Symptoms  none    Duration  Continue with 30 min of aerobic exercise without signs/symptoms of physical distress.    Intensity  THRR unchanged      Progression   Progression  Continue to progress workloads to maintain intensity without signs/symptoms of physical distress.    Average METs  4      Resistance Training   Training Prescription  Yes    Weight  3lbs    Reps  10-15    Time  10 Minutes      Interval Training   Interval Training  No      Treadmill   MPH  2.6    Grade  3    Minutes  5    METs  4.07      Recumbant Bike   Level  3.5 upright  scifit    Minutes  10    METs  5.1      NuStep   Level  6    SPM  90    Minutes  10    METs  3.5      Track   Laps  7    Minutes  5    METs  3.43      Home Exercise Plan   Plans to continue exercise at  Home (comment)    Frequency  Add 2 additional days to program exercise sessions.    Initial Home Exercises Provided  02/08/17       Nutrition:  Target Goals: Understanding of nutrition guidelines, daily intake of sodium <1569m, cholesterol <2013m calories 30% from fat and 7% or less from saturated fats, daily to have 5 or more servings of fruits and vegetables.  Biometrics: Pre Biometrics - 01/28/17 0948      Pre Biometrics   Height  _0  (1.6 m)    Weight  192 lb 3.9 oz (87.2 kg)    Waist Circumference  41.5 inches    Hip Circumference  43.5 inches    Waist to Hip Ratio  0.95 %    BMI (Calculated)  34.06    Triceps Skinfold  33 mm    % Body Fat  43.9 %    Grip Strength  33 kg    Flexibility  14 in    Single Leg Stand  20.28 seconds      Post Biometrics - 05/03/17 1510       Post  Biometrics   Waist Circumference  38.75 inches    Hip Circumference  42.5 inches    Waist to Hip Ratio  0.91 %    Triceps Skinfold  30 mm    Grip Strength  43.5 kg    Flexibility  15.5 in    Single Leg Stand  30 seconds       Nutrition Therapy Plan and Nutrition Goals: Nutrition Therapy & Goals - 01/28/17 1054      Nutrition Therapy   Diet  Heart Healthy      Personal Nutrition Goals   Nutrition Goal  Pt to identify and limit food sources  of saturated fat, trans fat, and sodium    Personal Goal #2  Pt to identify food quantities necessary to achieve weight loss of 6-24 lb (2.7-10.9 kg) at graduation from cardiac rehab.        Intervention Plan   Intervention  Prescribe, educate and counsel regarding individualized specific dietary modifications aiming towards targeted core components such as weight, hypertension, lipid management, diabetes, heart failure and other  comorbidities.    Expected Outcomes  Short Term Goal: Understand basic principles of dietary content, such as calories, fat, sodium, cholesterol and nutrients.;Long Term Goal: Adherence to prescribed nutrition plan.       Nutrition Assessments: Nutrition Assessments - 01/28/17 1055      MEDFICTS Scores   Pre Score  33       Nutrition Goals Re-Evaluation:   Nutrition Goals Re-Evaluation:   Nutrition Goals Discharge (Final Nutrition Goals Re-Evaluation):   Psychosocial: Target Goals: Acknowledge presence or absence of significant depression and/or stress, maximize coping skills, provide positive support system. Participant is able to verbalize types and ability to use techniques and skills needed for reducing stress and depression.  Initial Review & Psychosocial Screening: Initial Psych Review & Screening - 01/28/17 1259      Initial Review   Current issues with  Current Anxiety/Panic;Current Stress Concerns;Current Sleep Concerns    Source of Stress Concerns  Unable to participate in former interests or hobbies;Unable to perform yard/household activities;Chronic Illness    Comments  Pt reports feeling anxious regarding having another heart event      Family Dynamics   Good Support System?  Yes      Barriers   Psychosocial barriers to participate in program  The patient should benefit from training in stress management and relaxation.      Screening Interventions   Interventions  To provide support and resources with identified psychosocial needs;Encouraged to exercise       Quality of Life Scores: Quality of Life - 05/05/17 1717      Quality of Life Scores   Health/Function Pre  16.89 %    Health/Function Post  18.31 %    Health/Function % Change  8.41 %    Socioeconomic Pre  22.21 %    Socioeconomic Post  23.29 %    Socioeconomic % Change   4.86 %    Psych/Spiritual Pre  22.21 %    Psych/Spiritual Post  20.5 %    Psych/Spiritual % Change  -7.7 %    Family Pre   23.63 %    Family Post  24 %    Family % Change  1.57 %    GLOBAL Pre  20.06 %    GLOBAL Post  20.55 %    GLOBAL % Change  2.44 %      Scores of 19 and below usually indicate a poorer quality of life in these areas.  A difference of  2-3 points is a clinically meaningful difference.  A difference of 2-3 points in the total score of the Quality of Life Index has been associated with significant improvement in overall quality of life, self-image, physical symptoms, and general health in studies assessing change in quality of life.  PHQ-9: Recent Review Flowsheet Data    Depression screen [facility] 2/9 02/01/2017   Decreased Interest 0   Down, Depressed, Hopeless 1    PHQ - 2 Score 1     Interpretation of Total Score  Total Score Depression Severity:  1-4 = Minimal depression, 5-9 =  Mild depression, 10-14 = Moderate depression, 15-19 = Moderately severe depression, 20-27 = Severe depression   Psychosocial Evaluation and Intervention: Psychosocial Evaluation - 01/28/17 1302      Psychosocial Evaluation & Interventions   Interventions  Therapist referral;Physician referral;Relaxation education;Stress management education;Encouraged to exercise with the program and follow exercise prescription    Comments  Pt to [place] with Psychologist that she has seen in the past.  Pt to ask primary MD for anxiety medication that was discussed at the last follow up.  Appointment made with chaplain at [facility] depart for Monday 1/4 after exercise.    Expected Outcomes  Pt will seek help when needed and establish positive and healthy coping skills to contribute in a meaningful way to her community.    Continue Psychosocial Services   Follow up required by staff       Psychosocial Re-Evaluation: Psychosocial Re-Evaluation    [place] Name 02/10/17 1703 03/11/17 1458 04/08/17 1549 05/06/17 1155       Psychosocial Re-Evaluation   Current issues with  Current Anxiety/Panic  Current Anxiety/Panic  Current  Anxiety/Panic  Current Anxiety/Panic    Comments  Kristine Newman will continue to participate in cardiac rehab and meet with her therapist. Kristine Newman has been presribed zoloft for anxiety  Kristine Newman will continue to participate in cardiac rehab and meet with her therapist. Kristine Newman has been presribed zoloft for anxiety  Kristine Newman will continue to participate in cardiac rehab and meet with her therapist. Kristine Newman has been presribed zoloft for anxiety  Kristine Newman will continue to participate in cardiac rehab and meet with her therapist. Kristine Newman has been presribed zoloft for anxiety    Expected Outcomes  Kristine Newman will have less stress and anxiety upon completion of cardiac rehab.  Kristine Newman will have less stress and anxiety upon completion of cardiac rehab. Kristine Newman has been feeling better  Kristine Newman will have less stress and anxiety upon completion of cardiac rehab. Kristine Newman has been feeling better  Kristine Newman will have less stress and anxiety upon completion of cardiac rehab. Kristine Newman has been feeling better    Interventions  Stress management education;Physician referral;Therapist referral;Encouraged to attend Cardiac Rehabilitation for the exercise Kristine Newman meets with her therapist on a regular basis and saw her primary provider last week for anxiety  Stress management education;Physician referral;Therapist referral;Encouraged to attend Cardiac Rehabilitation for the exercise  Stress management education;Physician referral;Therapist referral;Encouraged to attend Cardiac Rehabilitation for the exercise  Stress management education;Physician referral;Therapist referral;Encouraged to attend Cardiac Rehabilitation for the exercise    Continue Psychosocial Services   Follow up required by staff  Follow up required by staff  Follow up required by staff  No Follow up required Kristine Newman doing so much better with stress management and anxiety and will graduate from cardiac rehab next week       Psychosocial Discharge (Final Psychosocial  Re-Evaluation): Psychosocial Re-Evaluation - 05/06/17 1155      Psychosocial Re-Evaluation   Current issues with  Current Anxiety/Panic    Comments  Kristine Newman will continue to participate in cardiac rehab and meet with her therapist. Kristine Newman has been presribed zoloft for anxiety    Expected Outcomes  Kristine Newman will have less stress and anxiety upon completion of cardiac rehab. Kristine Newman has been feeling better    Interventions  Stress management education;Physician referral;Therapist referral;Encouraged to attend Cardiac Rehabilitation for the exercise    Continue Psychosocial Services   No Follow up required Kristine Newman doing so much better with stress management and anxiety and will graduate from cardiac rehab next  week       Vocational Rehabilitation: Provide vocational rehab assistance to qualifying candidates.   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 Participant will state understanding/return demonstration of topics presented.  Learning Barriers/Preferences: Learning Barriers/Preferences - 01/28/17 8366      Learning Barriers/Preferences   Learning Barriers  Sight    Learning Preferences  Written Material       Education Topics: Count Your Pulse:  -Group instruction provided by verbal instruction, demonstration, patient participation and written materials to support subject.  Instructors address importance of being able to find your pulse and how to count your pulse when at home without a heart monitor.  Patients get hands on experience counting their pulse with staff help and individually.   CARDIAC REHAB PHASE II EXERCISE from 04/21/2017 in [place]  Date  02/12/17  Educator  RN      Heart Attack, Angina, and Risk Factor Modification:  -Group instruction provided by verbal instruction, video, and written materials to support subject.  Instructors address signs and symptoms of angina and heart attacks.    Also discuss risk factors for heart disease and how to make changes to improve heart health risk factors.   Functional Fitness:  -Group instruction provided by verbal instruction, demonstration, patient participation, and written materials to support subject.  Instructors address safety measures for doing things around the house.  Discuss how to get up and down off the floor, how to pick things up properly, how to safely get out of a chair without assistance, and balance training.   CARDIAC REHAB PHASE II EXERCISE from 04/21/2017 in [place]  Date  04/02/17  Instruction Review Code  2- Demonstrated Understanding      Meditation and Mindfulness:  -Group instruction provided by verbal instruction, patient participation, and written materials to support subject.  Instructor addresses importance of mindfulness and meditation practice to help reduce stress and improve awareness.  Instructor also leads participants through a meditation exercise.    CARDIAC REHAB PHASE II EXERCISE from 04/21/2017 in [place]  Date  04/07/17  Educator  Jeanella Craze  Instruction Review Code  2- Demonstrated Understanding      Stretching for Flexibility and Mobility:  -Group instruction provided by verbal instruction, patient participation, and written materials to support subject.  Instructors lead participants through series of stretches that are designed to increase flexibility thus improving mobility.  These stretches are additional exercise for major muscle groups that are typically performed during regular warm up and cool down.   Hands Only CPR:  -Group verbal,  video, and participation provides a basic overview of AHA guidelines for community CPR. Role-play of emergencies allow participants the opportunity to practice calling for help and chest compression technique with discussion of AED use.   CARDIAC REHAB PHASE II EXERCISE from 04/21/2017 in [place]  Date  02/19/17  Instruction Review Code  2- Demonstrated Understanding      Hypertension: -Group verbal and written instruction that provides a basic overview of hypertension including the most recent diagnostic guidelines, risk factor  reduction with self-care instructions and medication management.    Nutrition I class: Heart Healthy Eating:  -Group instruction provided by PowerPoint slides, verbal discussion, and written materials to support subject matter. The instructor gives an explanation and review of the Therapeutic Lifestyle Changes diet recommendations, which includes a discussion on lipid goals, dietary fat, sodium, fiber, plant stanol/sterol esters, sugar, and the components of a well-balanced, healthy diet.   CARDIAC REHAB PHASE II EXERCISE from 04/21/2017 in [place]  Date  01/28/17  Educator  RD      Nutrition II class: Lifestyle Skills:  -Group instruction provided by PowerPoint slides, verbal discussion, and written materials to support subject matter. The instructor gives an explanation and review of label reading, grocery shopping for heart health, heart healthy recipe modifications, and ways to make healthier choices when eating out.   CARDIAC REHAB PHASE II EXERCISE from 04/21/2017 in [place]  Date  01/28/17  Educator  RD      Diabetes Question & Answer:  -Group instruction provided by PowerPoint slides, verbal discussion, and written materials to support subject matter. The instructor gives an explanation and review of diabetes co-morbidities, pre- and post-prandial blood  glucose goals, pre-exercise blood glucose goals, signs, symptoms, and treatment of hypoglycemia and hyperglycemia, and foot care basics.   CARDIAC REHAB PHASE II EXERCISE from 04/21/2017 in [place]  Date  03/12/17  Educator  RD      Diabetes Blitz:  -Group instruction provided by PowerPoint slides, verbal discussion, and written materials to support subject matter. The instructor gives an explanation and review of the physiology behind type 1 and type 2 diabetes, diabetes medications and rational behind using different medications, pre- and post-prandial blood glucose recommendations and Hemoglobin A1c goals, diabetes diet, and exercise including blood glucose guidelines for exercising safely.    Portion Distortion:  -Group instruction provided by PowerPoint slides, verbal discussion, written materials, and food models to support subject matter. The instructor gives an explanation of serving size versus portion size, changes in portions sizes over the last 20 years, and what consists of a serving from each food group.   CARDIAC REHAB PHASE II EXERCISE from 04/21/2017 in [place]  Date  04/14/17  Educator  RD  Instruction Review Code  2- Demonstrated Understanding      Stress Management:  -Group instruction provided by verbal instruction, video, and written materials to support subject matter.  Instructors review role of stress in heart disease and how to cope with stress positively.     CARDIAC REHAB PHASE II EXERCISE from 04/21/2017 in [place]  Date  04/21/17  Instruction Review Code  2- Demonstrated Understanding      Exercising on Your Own:  -Group instruction provided by verbal instruction, power point, and written materials to support subject.  Instructors discuss benefits of exercise, components of exercise, frequency and intensity of exercise, and end points for exercise.  Also  discuss use of nitroglycerin and activating EMS.  Review options of places to exercise outside of rehab.  Review guidelines for sex with heart disease.   CARDIAC REHAB PHASE II EXERCISE from 04/21/2017 in [place]  Date  03/24/17  Educator  EP  Instruction Review Code  2- Demonstrated Understanding      Cardiac Drugs I:  -Group instruction provided by verbal instruction and written materials to support subject.  Instructor reviews cardiac drug classes: antiplatelets, anticoagulants, beta blockers, and statins.  Instructor discusses reasons, side effects, and lifestyle considerations for each drug class.   CARDIAC REHAB PHASE II EXERCISE from 04/21/2017 in [place]  Date  03/31/17  Educator  -- [pharmacy]  Instruction Review Code  2- Demonstrated Understanding      Cardiac Drugs II:  -Group instruction provided by verbal instruction and written materials to support subject.  Instructor reviews cardiac drug classes: angiotensin converting enzyme inhibitors (ACE-I), angiotensin II receptor blockers (ARBs), nitrates, and calcium channel blockers.  Instructor discusses reasons, side effects, and lifestyle considerations for each drug class.   CARDIAC REHAB PHASE II EXERCISE from 04/21/2017 in [place]  Date  03/03/17  Instruction Review Code  2- Demonstrated Understanding      Anatomy and Physiology of the Circulatory System:  Group verbal and written instruction and models provide basic cardiac anatomy and physiology, with the coronary electrical and arterial systems. Review of: AMI, Angina, Valve disease, Heart Failure, Peripheral Artery Disease, Cardiac Arrhythmia, Pacemakers, and the ICD.   CARDIAC REHAB PHASE II EXERCISE from 04/21/2017 in [place]  Date  03/10/17  Instruction Review Code  2- Demonstrated Understanding      Other Education:  -Group or  individual verbal, written, or video instructions that support the educational goals of the cardiac rehab program.   Holiday Eating Survival Tips:  -Group instruction provided by PowerPoint slides, verbal discussion, and written materials to support subject matter. The instructor gives patients tips, tricks, and techniques to help them not only survive but enjoy the holidays despite the onslaught of food that accompanies the holidays.   Knowledge Questionnaire Score: Knowledge Questionnaire Score - 05/05/17 1717      Knowledge Questionnaire Score   Pre Score  20/24    Post Score  23/24       Core Components/Risk Factors/Patient Goals at Admission: Personal Goals and Risk Factors at Admission - 01/28/17 1211      Core Components/Risk Factors/Patient Goals on Admission    Weight Management  Yes;Obesity;Weight Maintenance;Weight Loss    Intervention  Weight Management: Develop a combined nutrition and exercise program designed to reach desired caloric intake, while maintaining appropriate intake of nutrient and fiber, sodium and fats, and appropriate energy expenditure required for the weight goal.;Weight Management: Provide education and appropriate resources to help participant work on and attain dietary goals.;Weight Management/Obesity: Establish reasonable short term and long term weight goals.;Obesity: Provide education and appropriate resources to help participant work on and attain dietary goals.    Admit Weight  192 lb 3.9 oz (87.2 kg)    Goal Weight: Short Term  185 lb (83.9 kg)    Goal Weight: Long Term  155 lb (70.3 kg)    Expected Outcomes  Short Term: Continue to assess and modify interventions until short term weight is achieved;Long Term: Adherence to nutrition and physical activity/exercise program aimed toward attainment of established weight goal;Weight Maintenance: Understanding of the daily nutrition guidelines, which includes 25-35% calories from fat, 7% or less cal from  saturated fats, less than 236m cholesterol, less than 1.5gm of sodium, & 5 or more servings of fruits and vegetables daily;Weight Loss: Understanding of general recommendations for a balanced deficit meal plan, which promotes 1-2 lb weight loss per week and includes a negative energy balance of [phone] kcal/d;Understanding recommendations for meals to include 15-35% energy as protein, 25-35% energy from fat, 35-60%  energy from carbohydrates, less than 289m of dietary cholesterol, 20-35 gm of total fiber daily;Understanding of distribution of calorie intake throughout the day with the consumption of 4-5 meals/snacks    Hypertension  Yes    Intervention  Provide education on lifestyle modifcations including regular physical activity/exercise, weight management, moderate sodium restriction and increased consumption of fresh fruit, vegetables, and low fat dairy, alcohol moderation, and smoking cessation.;Monitor prescription use compliance.    Expected Outcomes  Short Term: Continued assessment and intervention until BP is < 140/945mHG in hypertensive participants. < 130/8065mG in hypertensive participants with diabetes, heart failure or chronic kidney disease.;Long Term: Maintenance of blood pressure at goal levels.    Lipids  Yes    Intervention  Provide education and support for participant on nutrition & aerobic/resistive exercise along with prescribed medications to achieve LDL <8m19mDL >40mg68m Expected Outcomes  Short Term: Participant states understanding of desired cholesterol values and is compliant with medications prescribed. Participant is following exercise prescription and nutrition guidelines.;Long Term: Cholesterol controlled with medications as prescribed, with individualized exercise RX and with personalized nutrition plan. Value goals: LDL < 8mg,45m > 40 mg.    Stress  Yes    Intervention  Offer individual and/or small group education and counseling on adjustment to heart disease,  stress management and health-related lifestyle change. Teach and support self-help strategies.;Refer participants experiencing significant psychosocial distress to appropriate mental health specialists for further evaluation and treatment. When possible, include family members and significant others in education/counseling sessions.    Expected Outcomes  Short Term: Participant demonstrates changes in health-related behavior, relaxation and other stress management skills, ability to obtain effective social support, and compliance with psychotropic medications if prescribed.;Long Term: Emotional wellbeing is indicated by absence of clinically significant psychosocial distress or social isolation.    Personal Goal Other  Yes    Personal Goal  Decrease fear avoidance and anxiety about recent cardiac event. Build confidence and learn about exercise/activity limitations    Intervention  Provide counseling on stress management and coping mechanisms. Further discuss activity restrictions and exercise programming to build on confidence with activity.     Expected Outcomes  Pt will have increase confidence and motivation for physical actvity/exercise and decrease stress and worries about cardiac event. More importantly, pt will have a better understanding of activity limitations and reduce modifiable risk factors.        Core Components/Risk Factors/Patient Goals Review:  Goals and Risk Factor Review    Row Name 02/10/17 1659 03/11/17 1455 04/08/17 1549 05/06/17 1154       Core Components/Risk Factors/Patient Goals Review   Personal Goals Review  Weight Management/Obesity;Lipids;Hypertension;Stress  Weight Management/Obesity;Lipids;Hypertension;Stress  Weight Management/Obesity;Lipids;Hypertension;Stress  Weight Management/Obesity;Lipids;Hypertension;Stress    Review  Kristine Newman vital signs have bee stable. IyannaAsjahf to a good start ton exercise  Ilynn'a vital signs have bee stable. Kristine Newman well  with exercise and has not reported any recent complaints of chest pain  Marilou'a vital signs have bee stable. IyannaPaizleying well with exercise and has not reported any recent complaints of chest pain  Anoushka'a vital signs have bee stable. IyannaAlonnieing well with exercise and has not reported any recent complaints of chest pain    Expected Outcomes  IyannaLindsicontinue to take her medications for HTN, Hyperlipidemia and follow a heart healthy diabetic diet.  IyannaSevannacontinue to take her medications for HTN, Hyperlipidemia and follow a heart healthy diabetic diet.  Tuleen will continue to take her medications for HTN, Hyperlipidemia and follow a heart healthy diabetic diet.  Nialah will continue to take her medications for HTN, Hyperlipidemia and follow a heart healthy diabetic diet.       Core Components/Risk Factors/Patient Goals at Discharge (Final Review):  Goals and Risk Factor Review - 05/06/17 1154      Core Components/Risk Factors/Patient Goals Review   Personal Goals Review  Weight Management/Obesity;Lipids;Hypertension;Stress    Review  Rim'a vital signs have bee stable. Kensley is doing well with exercise and has not reported any recent complaints of chest pain    Expected Outcomes  Jesseca will continue to take her medications for HTN, Hyperlipidemia and follow a heart healthy diabetic diet.       ITP Comments: ITP Comments    Row Name 01/28/17 0926 02/10/17 1655 03/11/17 1449 04/08/17 1549 05/06/17 1153   ITP Comments  Dr. Fransico Him, Medical Director  30 day ITP review. Ozetta is off to a good start to exercise. Will continue to monitor for signs of chest pain and give emotional support for anxiety  30 day ITP review. Patient with good attendance and participation in phase 2 cardiac rehab. Will continue to give positive feedback to the patient  30 day ITP review. Patient with good attendance and participation in phase 2 cardiac rehab. Will continue to give positive feedback  to the patient  30 day ITP review. Patient with good attendance and participation in phase 2 cardiac rehab. Will continue to give positive feedback to the patient      Comments: See ITP comments. Lilliona's vital signs have been stable on her most recent visits. Elica is graduating from cardiac rehab next week.Barnet Pall, RN,BSN 05/06/2017 12:10 PM

## 2017-05-10 ENCOUNTER — Encounter (HOSPITAL_COMMUNITY)
Admission: RE | Admit: 2017-05-10 | Discharge: 2017-05-10 | Disposition: A | Discharge status: Home / Self Care | Payer: BC Managed Care – PPO | Source: Ambulatory Visit | Attending: Cardiology | Admitting: Cardiology

## 2017-05-10 VITALS — BP 100/70 | HR 57 | Ht 63.0 in | Wt 183.4 lb

## 2017-05-10 DIAGNOSIS — I214 Non-ST elevation (NSTEMI) myocardial infarction: Secondary | ICD-10-CM

## 2017-05-10 DIAGNOSIS — Z48812 Encounter for surgical aftercare following surgery on the circulatory system: Secondary | ICD-10-CM | POA: Diagnosis not present

## 2017-05-10 DIAGNOSIS — Z955 Presence of coronary angioplasty implant and graft: Secondary | ICD-10-CM

## 2017-05-10 NOTE — Progress Notes (Addendum)
Discharge Progress Report  Patient Details  Name: Kristine Newman MRN: [id number] Date of Birth: Jun 20, 1975 Referring Provider:     [facility] from 01/28/2017 in [place]  Referring Provider  Vernell Leep MD       Number of Visits: 35  Reason for Discharge:  Patient independent in their exercise.  Smoking History:  Social History   Tobacco Use  Smoking Status Never Smoker  Smokeless Tobacco Never Used    Diagnosis:  NSTEMI (non-ST elevated myocardial infarction) ([place])  Status post coronary artery stent placement  ADL [facility]:   Initial Exercise Prescription: Initial Exercise Prescription - 01/28/17 1200      Date of Initial Exercise RX and Referring Provider   Date  01/28/17    Referring Provider  Vernell Leep MD      Treadmill   MPH  2.3    Grade  0    Minutes  10    METs  2.76      Recumbant Bike   Level  1.5 upright scifit    Minutes  10    METs  2      NuStep   Level  2    SPM  70    Minutes  10    METs  2      Prescription Details   Frequency (times per week)  3    Duration  Progress to 30 minutes of continuous aerobic without signs/symptoms of physical distress      Intensity   THRR 40-80% of Max Heartrate  71-142    Ratings of Perceived Exertion  11-15    Perceived Dyspnea  0-4      Progression   Progression  Continue to progress workloads to maintain intensity without signs/symptoms of physical distress.      Resistance Training   Training Prescription  Yes    Weight  2lbs    Reps  10-15       Discharge Exercise Prescription (Final Exercise Prescription Changes): Exercise Prescription Changes - 05/10/17 1452      Response to Exercise   Blood Pressure (Admit)  100/70    Blood Pressure (Exercise)  100/70    Blood Pressure (Exit)  102/72    Heart Rate (Admit)  57 bpm    Heart Rate (Exercise)  105 bpm    Heart Rate (Exit)  59 bpm    Rating of Perceived Exertion  (Exercise)  11    Symptoms  none    Duration  Continue with 30 min of aerobic exercise without signs/symptoms of physical distress.    Intensity  THRR unchanged      Progression   Progression  Continue to progress workloads to maintain intensity without signs/symptoms of physical distress.    Average METs  4.1      Resistance Training   Training Prescription  Yes    Weight  4lbs    Reps  10-15    Time  10 Minutes      Interval Training   Interval Training  No      Treadmill   MPH  2.6    Grade  4    Minutes  5    METs  4.43      Recumbant Bike   Level  3.5 upright scifit    Minutes  10    METs  5.1      NuStep   Level  6    SPM  90  Minutes  10    METs  3.3      Track   Laps  7    Minutes  5    METs  3.44      Home Exercise Plan   Plans to continue exercise at  Home (comment)    Frequency  Add 2 additional days to program exercise sessions.    Initial Home Exercises Provided  02/08/17       Functional Capacity: 6 Minute Walk    Row Name 01/28/17 0928 01/28/17 0947 01/28/17 1208     6 Minute Walk   Phase  Initial  -  -   Distance  1210 feet  -  -   Walk Time  6 minutes  -  -   # of Rest Breaks  0  -  -   MPH  -  2.3  -   METS  -  3.6  -   RPE  11  -  -   Perceived Dyspnea   1  -  -   VO2 Peak  -  12.45  -   Symptoms  Yes (comment)  -  -   Comments  SOB   -  -   Resting HR  -  55 bpm  -   Resting BP  112/64  112/64  -   Resting Oxygen Saturation   98 %  -  -   Exercise Oxygen Saturation  during 6 min walk  99 %  -  -   Max Ex. HR  -  83 bpm  -   Max Ex. BP  118/62  -  -   2 Minute Post BP  -  -  110/68   Row Name 05/03/17 1501         6 Minute Walk   Phase  Discharge     Distance  1636 feet     Distance % Change  35.2 %     Distance Feet Change  426 ft     Walk Time  6 minutes     # of Rest Breaks  0     MPH  3.1     METS  4.41     RPE  10     Perceived Dyspnea   0     VO2 Peak  15.44     Symptoms  No     Resting HR  56 bpm      Resting BP  98/60     Max Ex. HR  84 bpm     Max Ex. BP  122/80     2 Minute Post BP  107/71        Psychological, QOL, Others - Outcomes: PHQ 2/9: Depression screen [facility] 2/9 06/03/2017 02/01/2017  Decreased Interest 0 0  Down, Depressed, Hopeless 0 1  PHQ - 2 Score 0 1    Quality of Life: Quality of Life - 05/05/17 1717      Quality of Life Scores   Health/Function Pre  16.89 %    Health/Function Post  18.31 %    Health/Function % Change  8.41 %    Socioeconomic Pre  22.21 %    Socioeconomic Post  23.29 %    Socioeconomic % Change   4.86 %    Psych/Spiritual Pre  22.21 %    Psych/Spiritual Post  20.5 %    Psych/Spiritual % Change  -7.7 %    Family Pre  23.63 %  Family Post  24 %    Family % Change  1.57 %    GLOBAL Pre  20.06 %    GLOBAL Post  20.55 %    GLOBAL % Change  2.44 %       Personal Goals: Goals established at orientation with interventions provided to work toward goal. Personal Goals and Risk Factors at Admission - 01/28/17 1211      Core Components/Risk Factors/Patient Goals on Admission    Weight Management  Yes;Obesity;Weight Maintenance;Weight Loss    Intervention  Weight Management: Develop a combined nutrition and exercise program designed to reach desired caloric intake, while maintaining appropriate intake of nutrient and fiber, sodium and fats, and appropriate energy expenditure required for the weight goal.;Weight Management: Provide education and appropriate resources to help participant work on and attain dietary goals.;Weight Management/Obesity: Establish reasonable short term and long term weight goals.;Obesity: Provide education and appropriate resources to help participant work on and attain dietary goals.    Admit Weight  192 lb 3.9 oz (87.2 kg)    Goal Weight: Short Term  185 lb (83.9 kg)    Goal Weight: Long Term  155 lb (70.3 kg)    Expected Outcomes  Short Term: Continue to assess and modify interventions until short term weight is  achieved;Long Term: Adherence to nutrition and physical activity/exercise program aimed toward attainment of established weight goal;Weight Maintenance: Understanding of the daily nutrition guidelines, which includes 25-35% calories from fat, 7% or less cal from saturated fats, less than 271m cholesterol, less than 1.5gm of sodium, & 5 or more servings of fruits and vegetables daily;Weight Loss: Understanding of general recommendations for a balanced deficit meal plan, which promotes 1-2 lb weight loss per week and includes a negative energy balance of [phone] kcal/d;Understanding recommendations for meals to include 15-35% energy as protein, 25-35% energy from fat, 35-60% energy from carbohydrates, less than 2073mof dietary cholesterol, 20-35 gm of total fiber daily;Understanding of distribution of calorie intake throughout the day with the consumption of 4-5 meals/snacks    Hypertension  Yes    Intervention  Provide education on lifestyle modifcations including regular physical activity/exercise, weight management, moderate sodium restriction and increased consumption of fresh fruit, vegetables, and low fat dairy, alcohol moderation, and smoking cessation.;Monitor prescription use compliance.    Expected Outcomes  Short Term: Continued assessment and intervention until BP is < 140/9013mG in hypertensive participants. < 130/75m39m in hypertensive participants with diabetes, heart failure or chronic kidney disease.;Long Term: Maintenance of blood pressure at goal levels.    Lipids  Yes    Intervention  Provide education and support for participant on nutrition & aerobic/resistive exercise along with prescribed medications to achieve LDL <70mg52mL >40mg.77mExpected Outcomes  Short Term: Participant states understanding of desired cholesterol values and is compliant with medications prescribed. Participant is following exercise prescription and nutrition guidelines.;Long Term: Cholesterol controlled with  medications as prescribed, with individualized exercise RX and with personalized nutrition plan. Value goals: LDL < 70mg, 105m> 40 mg.    Stress  Yes    Intervention  Offer individual and/or small group education and counseling on adjustment to heart disease, stress management and health-related lifestyle change. Teach and support self-help strategies.;Refer participants experiencing significant psychosocial distress to appropriate mental health specialists for further evaluation and treatment. When possible, include family members and significant others in education/counseling sessions.    Expected Outcomes  Short Term: Participant demonstrates changes in health-related behavior, relaxation and  other stress management skills, ability to obtain effective social support, and compliance with psychotropic medications if prescribed.;Long Term: Emotional wellbeing is indicated by absence of clinically significant psychosocial distress or social isolation.    Personal Goal Other  Yes    Personal Goal  Decrease fear avoidance and anxiety about recent cardiac event. Build confidence and learn about exercise/activity limitations    Intervention  Provide counseling on stress management and coping mechanisms. Further discuss activity restrictions and exercise programming to build on confidence with activity.     Expected Outcomes  Pt will have increase confidence and motivation for physical actvity/exercise and decrease stress and worries about cardiac event. More importantly, pt will have a better understanding of activity limitations and reduce modifiable risk factors.         Personal Goals Discharge: Goals and Risk Factor Review    Row Name 02/10/17 1659 03/11/17 1455 04/08/17 1549 05/06/17 1154       Core Components/Risk Factors/Patient Goals Review   Personal Goals Review  Weight Management/Obesity;Lipids;Hypertension;Stress  Weight Management/Obesity;Lipids;Hypertension;Stress  Weight  Management/Obesity;Lipids;Hypertension;Stress  Weight Management/Obesity;Lipids;Hypertension;Stress    Review  Saroya'a vital signs have bee stable. Livana is off to a good start ton exercise  Cicely'a vital signs have bee stable. Lark is doing well with exercise and has not reported any recent complaints of chest pain  Ilisa'a vital signs have bee stable. Keaundra is doing well with exercise and has not reported any recent complaints of chest pain  Aleeha'a vital signs have bee stable. Amayra is doing well with exercise and has not reported any recent complaints of chest pain    Expected Outcomes  Darnell will continue to take her medications for HTN, Hyperlipidemia and follow a heart healthy diabetic diet.  Janaisha will continue to take her medications for HTN, Hyperlipidemia and follow a heart healthy diabetic diet.  Anecia will continue to take her medications for HTN, Hyperlipidemia and follow a heart healthy diabetic diet.  Brittney will continue to take her medications for HTN, Hyperlipidemia and follow a heart healthy diabetic diet.       Exercise Goals and Review: Exercise Goals    Row Name 01/28/17 0929             Exercise Goals   Increase Physical Activity  Yes       Intervention  Provide advice, education, support and counseling about physical activity/exercise needs.;Develop an individualized exercise prescription for aerobic and resistive training based on initial evaluation findings, risk stratification, comorbidities and participant's personal goals.       Expected Outcomes  Achievement of increased cardiorespiratory fitness and enhanced flexibility, muscular endurance and strength shown through measurements of functional capacity and personal statement of participant.       Increase Strength and Stamina  Yes       Intervention  Provide advice, education, support and counseling about physical activity/exercise needs.;Develop an individualized exercise prescription for aerobic and  resistive training based on initial evaluation findings, risk stratification, comorbidities and participant's personal goals.       Expected Outcomes  Achievement of increased cardiorespiratory fitness and enhanced flexibility, muscular endurance and strength shown through measurements of functional capacity and personal statement of participant.       Able to understand and use rate of perceived exertion (RPE) scale  Yes       Intervention  Provide education and explanation on how to use RPE scale       Expected Outcomes  Short Term: Able to use RPE  daily in rehab to express subjective intensity level;Long Term:  Able to use RPE to guide intensity level when exercising independently       Knowledge and understanding of Target Heart Rate Range (THRR)  Yes       Intervention  Provide education and explanation of THRR including how the numbers were predicted and where they are located for reference       Expected Outcomes  Short Term: Able to state/look up THRR;Short Term: Able to use daily as guideline for intensity in rehab;Long Term: Able to use THRR to govern intensity when exercising independently       Able to check pulse independently  Yes       Intervention  Provide education and demonstration on how to check pulse in carotid and radial arteries.;Review the importance of being able to check your own pulse for safety during independent exercise       Expected Outcomes  Short Term: Able to explain why pulse checking is important during independent exercise;Long Term: Able to check pulse independently and accurately       Understanding of Exercise Prescription  Yes       Intervention  Provide education, explanation, and written materials on patient's individual exercise prescription       Expected Outcomes  Short Term: Able to explain program exercise prescription;Long Term: Able to explain home exercise prescription to exercise independently          Nutrition & Weight - Outcomes: Pre  Biometrics - 01/28/17 0948      Pre Biometrics   Height  _0  (1.6 m)    Weight  192 lb 3.9 oz (87.2 kg)    Waist Circumference  41.5 inches    Hip Circumference  43.5 inches    Waist to Hip Ratio  0.95 %    BMI (Calculated)  34.06    Triceps Skinfold  33 mm    % Body Fat  43.9 %    Grip Strength  33 kg    Flexibility  14 in    Single Leg Stand  20.28 seconds      Post Biometrics - 05/10/17 1452       Post  Biometrics   Height  _1  (1.6 m)    Weight  183 lb 6.8 oz (83.2 kg)    Waist Circumference  38.75 inches    Hip Circumference  42.5 inches    Waist to Hip Ratio  0.91 %    BMI (Calculated)  32.5    Triceps Skinfold  30 mm    % Body Fat  41.6 %    Grip Strength  43.5 kg    Flexibility  15.5 in    Single Leg Stand  30 seconds       Nutrition: Nutrition Therapy & Goals - 01/28/17 1054      Nutrition Therapy   Diet  Heart Healthy      Personal Nutrition Goals   Nutrition Goal  Pt to identify and limit food sources of saturated fat, trans fat, and sodium    Personal Goal #2  Pt to identify food quantities necessary to achieve weight loss of 6-24 lb (2.7-10.9 kg) at graduation from cardiac rehab.        Intervention Plan   Intervention  Prescribe, educate and counsel regarding individualized specific dietary modifications aiming towards targeted core components such as weight, hypertension, lipid management, diabetes, heart failure and other comorbidities.    Expected Outcomes  Short  Term Goal: Understand basic principles of dietary content, such as calories, fat, sodium, cholesterol and nutrients.;Long Term Goal: Adherence to prescribed nutrition plan.       Nutrition Discharge: Nutrition Assessments - 05/26/17 1122      MEDFICTS Scores   Pre Score  33    Post Score  12    Score Difference  -21       Education Questionnaire Score: Knowledge Questionnaire Score - 05/05/17 1717      Knowledge Questionnaire Score   Pre Score  20/24    Post Score  23/24        Goals reviewed with patient; copy given to patient. Pt graduated from cardiac rehab program today with completion of 35 exercise sessions in Phase II. Pt maintained good attendance and progressed nicely during her participation in rehab as evidenced by increased MET level.   Medication list reconciled. Repeat  PHQ score- 0 .  Pt has made significant lifestyle changes and should be commended for her success. Pt feels she has achieved her goals during cardiac rehab.   Pt plans to continue exercise by walking and going to the gym.[place] feels much stronger and less depressed. Tarita lost 4 kg since beginning the program and increased her distance on her post exercise walk test.. We are proud of Keylee's progress and lifestyle changes.Barnet Pall, RN,BSN 06/03/2017 11:11 AM

## 2017-06-29 ENCOUNTER — Other Ambulatory Visit: Payer: Self-pay | Admitting: Family Medicine

## 2017-06-29 DIAGNOSIS — R748 Abnormal levels of other serum enzymes: Secondary | ICD-10-CM

## 2017-07-08 ENCOUNTER — Other Ambulatory Visit: Payer: Self-pay | Admitting: Family Medicine

## 2017-07-09 ENCOUNTER — Ambulatory Visit
Admission: RE | Admit: 2017-07-09 | Discharge: 2017-07-09 | Disposition: A | Discharge status: Home / Self Care | Payer: BC Managed Care – PPO | Source: Ambulatory Visit | Attending: Family Medicine | Admitting: Family Medicine

## 2017-07-09 DIAGNOSIS — R748 Abnormal levels of other serum enzymes: Secondary | ICD-10-CM

## 2017-07-10 ENCOUNTER — Other Ambulatory Visit: Payer: Self-pay | Admitting: Family Medicine

## 2017-07-10 DIAGNOSIS — N644 Mastodynia: Secondary | ICD-10-CM

## 2017-07-23 ENCOUNTER — Ambulatory Visit
Admission: RE | Admit: 2017-07-23 | Discharge: 2017-07-23 | Disposition: A | Discharge status: Home / Self Care | Payer: BC Managed Care – PPO | Source: Ambulatory Visit | Attending: Family Medicine | Admitting: Family Medicine

## 2017-07-23 ENCOUNTER — Ambulatory Visit: Payer: BC Managed Care – PPO

## 2017-07-23 DIAGNOSIS — N644 Mastodynia: Secondary | ICD-10-CM

## 2018-03-03 ENCOUNTER — Encounter: Payer: BC Managed Care – PPO | Admitting: Cardiology

## 2018-03-03 ENCOUNTER — Encounter: Payer: Self-pay | Admitting: Cardiology

## 2018-03-03 VITALS — BP 117/68 | HR 65 | Ht 63.0 in | Wt 194.3 lb

## 2018-03-03 DIAGNOSIS — I2510 Atherosclerotic heart disease of native coronary artery without angina pectoris: Secondary | ICD-10-CM

## 2018-03-03 NOTE — Progress Notes (Deleted)
43 year old African-American female with hypertension, hyperlipidemia, prediabetes, family history of premature CAD, with coronary artery disease status post NSTEMI treated with mid RCA and LAD diagonal bifurcation stenting in 10/2016.  Given her complex CAD and no bleeding, I had recommended DAPT beyond 1 year of mandatory tratment post PCI. Her blood pressure and lipid profle has signifciantly improved since 2018.  Patient is here for 6 month follow up.   [facility] echocardiogram 11/16/2016: - Left ventricle: The cavity size was normal. Systolic function was   normal. The estimated ejection fraction was in the range of 55%   to 60%. Mild hypokinesis of the basalinferolateral myocardium.   Left ventricular diastolic function parameters were normal. - No significant valvular abnormality  *** Cath 11/16/2016 and 11/18/2016: Severe LAD diagonal bifurcation 90% stenosis.   Moderate to severe proximal to mid RCA stenosis Resolute Onyx 3.0 X 30 mm mid LAD Synergy 2.5 X 20 mm Diag Synergy 3.0 X 32 mm prox-mid RCA  Labs: Lipid panel 08/26/2017: Cholesterol 144, triglycerides 95, HDL 44, LDL 81  Labs 02/23/2017: Cholesterol 122, triglycerides 215, HDL 35, LDL 44  LDL reduced from 192 to 44, compared to prior lipir panel 3 months ago  Lipid profile 11/16/2016: Cholesterol 275, LDL 190, HDL 45, triglyceride 199.   *** 43 year old African-American female with hypertension, hyperlipidemia, prediabetes, family history of premature CAD, with coronary artery disease status post NSTEMI treated with mid RCA and LAD diagonal bifurcation stenting in 10/2016.  *** CAD: Stable. No angina symptoms. In absence of any bleeding, I would recommend continuing DAPT normal in one year. Continue metoprolol, lisinopril, statin.  *** Hypertension: Well controlled.  *** Hyperlipidemia: Much improved.  I recommended heart healthy diet that includes more of lean meat, fish, fruits, vegetables,  and less of processed food, red meat, dairy, cheese, fried food, sugar, excess salt. Recommend daily exercise with at least 30 min walking or 15 min high intensity exercise most days a week.  I will see her back in 6 months.

## 2018-03-04 ENCOUNTER — Encounter: Payer: Self-pay | Admitting: Cardiology

## 2018-03-04 ENCOUNTER — Ambulatory Visit (INDEPENDENT_AMBULATORY_CARE_PROVIDER_SITE_OTHER): Payer: BC Managed Care – PPO | Admitting: Cardiology

## 2018-03-04 VITALS — BP 125/82 | HR 64 | Ht 63.0 in | Wt 195.1 lb

## 2018-03-04 DIAGNOSIS — I2510 Atherosclerotic heart disease of native coronary artery without angina pectoris: Secondary | ICD-10-CM | POA: Diagnosis not present

## 2018-03-04 DIAGNOSIS — E1159 Type 2 diabetes mellitus with other circulatory complications: Secondary | ICD-10-CM | POA: Diagnosis not present

## 2018-03-04 DIAGNOSIS — I10 Essential (primary) hypertension: Secondary | ICD-10-CM | POA: Diagnosis not present

## 2018-03-04 MED ORDER — METOPROLOL SUCCINATE ER 50 MG PO TB24: 50.0000 mg | ORAL_TABLET | Freq: Every day | ORAL | 3 refills | Status: DC

## 2018-03-04 NOTE — Progress Notes (Signed)
Patient is here for follow up visit.  Subjective:   @Patient  ID: Kristine Newman, female    DOB: 10/18/1975, 43 y.o.   MRN: [id number]  Chief Complaint  Patient presents with  . Coronary Artery Disease    6 month F/U    HPI   43 year old African-American female with hypertension, hyperlipidemia, prediabetes, family history of premature CAD, with coronary artery disease status post NSTEMI treated with mid RCA and LAD diagonal bifurcation stenting in 10/2016.  Given her complex CAD and no bleeding, I had recommended DAPT beyond 1 year of mandatory tratment post PCI. Her blood pressure and lipid profle has signifciantly improved since 2018.  Patient is here for 6 month follow up. She is doing very well and denies chest pain, shortness of breath, palpitations, leg edema, orthopnea, PND, TIA/syncope.    Past Medical History:  Diagnosis Date  . Anxiety   . High cholesterol   . Hypertension   . NSTEMI (non-ST elevated myocardial infarction) (HCC) 11/15/2016  . Pre-diabetes     Past Surgical History:  Procedure Laterality Date  . CORONARY ANGIOPLASTY WITH STENT PLACEMENT    . CORONARY BALLOON ANGIOPLASTY N/A 11/16/2016   Procedure: CORONARY BALLOON ANGIOPLASTY;  Surgeon: Elder NegusPatwardhan, Jaydalyn Demattia J, MD;  Location: [facility];  Service: Cardiovascular;  Laterality: N/A;  . CORONARY STENT INTERVENTION N/A 11/16/2016   Procedure: CORONARY STENT INTERVENTION;  Surgeon: Elder NegusPatwardhan, Setareh Rom J, MD;  Location: [facility];  Service: Cardiovascular;  Laterality: N/A;  . CORONARY STENT INTERVENTION N/A 11/18/2016   Procedure: CORONARY STENT INTERVENTION;  Surgeon: Elder NegusPatwardhan, Quinnley Colasurdo J, MD;  Location: [facility];  Service: Cardiovascular;  Laterality: N/A;  . LEFT HEART CATH AND CORONARY ANGIOGRAPHY N/A 11/16/2016   Procedure: LEFT HEART CATH AND CORONARY ANGIOGRAPHY;  Surgeon: Elder NegusPatwardhan, Ernestine Langworthy J, MD;  Location: [facility];  Service: Cardiovascular;  Laterality: N/A;     Social History   Socioeconomic History  . Marital status: Single    Spouse name: Not on file  . Number of children: Not on file  . Years of education: Not on file  . Highest education level: Not on file  Occupational History  . Not on file  Social Needs  . Financial resource strain: Not on file  . Food insecurity:    Worry: Not on file    Inability: Not on file  . Transportation needs:    Medical: Not on file    Non-medical: Not on file  Tobacco Use  . Smoking status: Never Smoker  . Smokeless tobacco: Never Used  Substance and Sexual Activity  . Alcohol use: Yes    Alcohol/week: 1.0 standard drinks    Types: 1 Shots of liquor per week    Comment: occasional   . Drug use: No  . Sexual activity: Never  Lifestyle  . Physical activity:    Days per week: Not on file    Minutes per session: Not on file  . Stress: Not on file  Relationships  . Social connections:    Talks on phone: Not on file    Gets together: Not on file    Attends religious service: Not on file    Active member of club or organization: Not on file    Attends meetings of clubs or organizations: Not on file    Relationship status: Not on file  . Intimate partner violence:    Fear of current or ex partner: Not on file    Emotionally  abused: Not on file    Physically abused: Not on file    Forced sexual activity: Not on file  Other Topics Concern  . Not on file  Social History Narrative  . Not on file    Current Outpatient Medications on File Prior to Visit  Medication Sig Dispense Refill  . acetaminophen (TYLENOL) 500 MG tablet Take 500 mg every 6 (six) hours as needed by mouth for mild pain.    Marland Kitchen aspirin EC 81 MG tablet Take 81 mg by mouth daily.    Marland Kitchen atorvastatin (LIPITOR) 80 MG tablet Take 1 tablet (80 mg total) by mouth daily at 6 PM. 90 tablet 3  . clopidogrel (PLAVIX) 75 MG tablet Take 75 mg by mouth daily.    . fluticasone (FLONASE) 50 MCG/ACT nasal spray Place 2 sprays daily as needed  into both nostrils for allergies.     Marland Kitchen guaiFENesin (MUCINEX) 600 MG 12 hr tablet Take 600 mg by mouth 2 (two) times daily as needed for to loosen phlegm.    Marland Kitchen ipratropium (ATROVENT) 0.06 % nasal spray Place 2 sprays into the nose daily as needed for rhinitis.   5  . lisinopril (PRINIVIL,ZESTRIL) 10 MG tablet Take 1 tablet (10 mg total) by mouth daily. (Patient taking differently: Take 10 mg by mouth daily. ) 90 tablet 3  . nitroGLYCERIN (NITROSTAT) 0.4 MG SL tablet Place 1 tablet (0.4 mg total) under the tongue every 5 (five) minutes as needed for chest pain. 30 tablet 3  . PROAIR HFA 108 (90 Base) MCG/ACT inhaler Take 2 puffs by mouth every 4 (four) hours as needed for wheezing or shortness of breath.  0  . sertraline (ZOLOFT) 50 MG tablet Take 50 mg by mouth daily.    . pantoprazole (PROTONIX) 40 MG tablet Take 40 mg by mouth daily as needed.     . Vitamin D, Ergocalciferol, (DRISDOL) 50000 units CAPS capsule Take 50,000 Units by mouth every Tuesday.   0   No current facility-administered medications on file prior to visit.     Cardiovascular studies:  Hospital echocardiogram 11/16/2016: - Left ventricle: The cavity size was normal. Systolic function was   normal. The estimated ejection fraction was in the range of 55%   to 60%. Mild hypokinesis of the basalinferolateral myocardium.   Left ventricular diastolic function parameters were normal. - No significant valvular abnormality   Cath 11/16/2016 and 11/18/2016: Severe LAD diagonal bifurcation 90% stenosis.   Moderate to severe proximal to mid RCA stenosis Resolute Onyx 3.0 X 30 mm mid LAD Synergy 2.5 X 20 mm Diag Synergy 3.0 X 32 mm prox-mid RCA  Labs:  Lipid panel 08/26/2017: Cholesterol 144, triglycerides 95, HDL 44, LDL 81  Labs 02/23/2017: Cholesterol 122, triglycerides 215, HDL 35, LDL 44  LDL reduced from 192 to 44, compared to prior lipir panel 3 months ago  Lipid profile 11/16/2016: Cholesterol 275, LDL 190, HDL  45, triglyceride 199.   Review of Systems  Constitution: Negative for decreased appetite, malaise/fatigue, weight gain and weight loss.  HENT: Negative for congestion.   Eyes: Negative for visual disturbance.  Cardiovascular: Negative for chest pain, claudication, dyspnea on exertion, leg swelling, palpitations and syncope.  Respiratory: Negative for shortness of breath.   Endocrine: Negative for cold intolerance.  Hematologic/Lymphatic: Does not bruise/bleed easily.  Skin: Negative for itching and rash.  Musculoskeletal: Negative for myalgias.  Gastrointestinal: Negative for abdominal pain, nausea and vomiting.  Genitourinary: Negative for dysuria.  Neurological: Negative for dizziness  and weakness.  Psychiatric/Behavioral: The patient is not nervous/anxious.   All other systems reviewed and are negative.      Objective:   Vitals:   03/04/18 1451  BP: 125/82  Pulse: 64  SpO2: 100%     Physical Exam  Constitutional: She is oriented to person, place, and time. She appears well-developed and well-nourished. No distress.  HENT:  Head: Normocephalic and atraumatic.  Eyes: Pupils are equal, round, and reactive to light. Conjunctivae are normal.  Neck: No JVD present.  Cardiovascular: Normal rate, regular rhythm and intact distal pulses.  Pulmonary/Chest: Effort normal and breath sounds normal. She has no wheezes. She has no rales.  Abdominal: Soft. Bowel sounds are normal. There is no rebound.  Musculoskeletal:        General: No edema.  Lymphadenopathy:    She has no cervical adenopathy.  Neurological: She is alert and oriented to person, place, and time. No cranial nerve deficit.  Skin: Skin is warm and dry.  Psychiatric: She has a normal mood and affect.  Nursing note and vitals reviewed.       Assessment & Recommendations:    43 year old African-American female with hypertension, hyperlipidemia, prediabetes, family history of premature CAD, with coronary artery  disease status post NSTEMI treated with mid RCA and LAD diagonal bifurcation stenting in 10/2016.   CAD: Stable. No angina symptoms. In absence of any bleeding, I would recommend continuing DAPT normal in one year. Continue metoprolol, lisinopril, statin.  Hypertension: Well controlled. Continue lisinopril 50 mg daily. Switched metoprolol tartarate 25 mg bid to metoprolol succinate 50 mg daily.   Hyperlipidemia: Much improved.Continue atorvastatin 80 mg daily.  I recommended heart healthy diet that includes more of lean meat, fish, fruits, vegetables, and less of processed food, red meat, dairy, cheese, fried food, sugar, excess salt. Recommend daily exercise with at least 30 min walking or 15 min high intensity exercise most days a week.  Follow up with me in 1 year with repeat BMP and lipid panel.   Elder Negus, MD [facility] [place] Pager: [phone] Office: [phone] If no answer Cell [phone]

## 2018-03-04 NOTE — Progress Notes (Signed)
Not seen by me on 02/13. Appt was rescheduled.

## 2018-03-05 ENCOUNTER — Encounter: Payer: Self-pay | Admitting: Cardiology

## 2018-04-28 ENCOUNTER — Other Ambulatory Visit: Payer: Self-pay

## 2018-04-28 MED ORDER — LISINOPRIL 10 MG PO TABS: 10.0000 mg | ORAL_TABLET | Freq: Every day | ORAL | 3 refills | Status: DC

## 2018-05-16 ENCOUNTER — Other Ambulatory Visit: Payer: Self-pay

## 2018-05-16 DIAGNOSIS — I2510 Atherosclerotic heart disease of native coronary artery without angina pectoris: Secondary | ICD-10-CM

## 2018-05-17 ENCOUNTER — Other Ambulatory Visit: Payer: Self-pay | Admitting: Cardiology

## 2018-05-17 DIAGNOSIS — I2510 Atherosclerotic heart disease of native coronary artery without angina pectoris: Secondary | ICD-10-CM

## 2018-05-17 MED ORDER — METOPROLOL SUCCINATE ER 50 MG PO TB24: 50.0000 mg | ORAL_TABLET | Freq: Every day | ORAL | 3 refills | Status: DC

## 2018-06-10 ENCOUNTER — Other Ambulatory Visit: Payer: Self-pay | Admitting: Cardiology

## 2018-06-10 ENCOUNTER — Other Ambulatory Visit: Payer: Self-pay

## 2018-06-10 MED ORDER — CLOPIDOGREL BISULFATE 75 MG PO TABS: 75.0000 mg | ORAL_TABLET | Freq: Every day | ORAL | 3 refills | Status: DC

## 2018-08-05 ENCOUNTER — Other Ambulatory Visit: Payer: Self-pay

## 2018-08-05 DIAGNOSIS — I2510 Atherosclerotic heart disease of native coronary artery without angina pectoris: Secondary | ICD-10-CM

## 2018-08-05 MED ORDER — METOPROLOL SUCCINATE ER 50 MG PO TB24: 50.0000 mg | ORAL_TABLET | Freq: Every day | ORAL | 3 refills | Status: DC

## 2018-11-02 ENCOUNTER — Other Ambulatory Visit: Payer: Self-pay | Admitting: Family Medicine

## 2018-11-02 DIAGNOSIS — Z1231 Encounter for screening mammogram for malignant neoplasm of breast: Secondary | ICD-10-CM

## 2018-12-22 ENCOUNTER — Other Ambulatory Visit: Payer: Self-pay

## 2018-12-22 ENCOUNTER — Ambulatory Visit
Admission: RE | Admit: 2018-12-22 | Discharge: 2018-12-22 | Disposition: A | Discharge status: Home / Self Care | Payer: BC Managed Care – PPO | Source: Ambulatory Visit | Attending: Family Medicine | Admitting: Family Medicine

## 2018-12-22 DIAGNOSIS — Z1231 Encounter for screening mammogram for malignant neoplasm of breast: Secondary | ICD-10-CM

## 2019-02-21 ENCOUNTER — Other Ambulatory Visit: Payer: Self-pay

## 2019-02-21 MED ORDER — ATORVASTATIN CALCIUM 80 MG PO TABS: 80.0000 mg | ORAL_TABLET | Freq: Every day | ORAL | 3 refills | Status: DC

## 2019-02-21 MED ORDER — NITROGLYCERIN 0.4 MG SL SUBL: 0.4000 mg | SUBLINGUAL_TABLET | SUBLINGUAL | 3 refills | Status: DC | PRN

## 2019-02-23 ENCOUNTER — Other Ambulatory Visit (HOSPITAL_COMMUNITY): Payer: Self-pay | Admitting: Cardiology

## 2019-02-24 LAB — BASIC METABOLIC PANEL
BUN/Creatinine Ratio: 13 (ref 9–23)
BUN: 11 mg/dL (ref 6–24)
CO2: 19 mmol/L — ABNORMAL LOW (ref 20–29)
Calcium: 9.2 mg/dL (ref 8.7–10.2)
Chloride: 104 mmol/L (ref 96–106)
Creatinine, Ser: 0.86 mg/dL (ref 0.57–1.00)
GFR calc Af Amer: 94 mL/min/{1.73_m2} (ref >59)
GFR calc non Af Amer: 82 mL/min/{1.73_m2} (ref >59)
Glucose: 100 mg/dL — ABNORMAL HIGH (ref 65–99)
Potassium: 4.5 mmol/L (ref 3.5–5.2)
Sodium: 138 mmol/L (ref 134–144)

## 2019-02-24 LAB — LIPID PANEL
Chol/HDL Ratio: 3.7 ratio (ref 0.0–4.4)
Cholesterol, Total: 141 mg/dL (ref 100–199)
HDL: 38 mg/dL — ABNORMAL LOW (ref >39)
LDL Chol Calc (NIH): 77 mg/dL (ref 0–99)
Triglycerides: 151 mg/dL — ABNORMAL HIGH (ref 0–149)
VLDL Cholesterol Cal: 26 mg/dL (ref 5–40)

## 2019-03-03 ENCOUNTER — Other Ambulatory Visit: Payer: Self-pay | Admitting: Cardiology

## 2019-03-03 ENCOUNTER — Other Ambulatory Visit: Payer: Self-pay

## 2019-03-03 ENCOUNTER — Ambulatory Visit: Payer: BC Managed Care – PPO | Admitting: Cardiology

## 2019-03-03 ENCOUNTER — Telehealth: Payer: Self-pay

## 2019-03-03 ENCOUNTER — Encounter: Payer: Self-pay | Admitting: Cardiology

## 2019-03-03 VITALS — BP 125/73 | HR 63 | Temp 97.8°F | Ht 63.0 in | Wt 201.0 lb

## 2019-03-03 DIAGNOSIS — I2510 Atherosclerotic heart disease of native coronary artery without angina pectoris: Secondary | ICD-10-CM

## 2019-03-03 DIAGNOSIS — I10 Essential (primary) hypertension: Secondary | ICD-10-CM

## 2019-03-03 DIAGNOSIS — E782 Mixed hyperlipidemia: Secondary | ICD-10-CM

## 2019-03-03 DIAGNOSIS — E119 Type 2 diabetes mellitus without complications: Secondary | ICD-10-CM | POA: Insufficient documentation

## 2019-03-03 DIAGNOSIS — E1159 Type 2 diabetes mellitus with other circulatory complications: Secondary | ICD-10-CM

## 2019-03-03 MED ORDER — REPATHA 140 MG/ML ~~LOC~~ SOSY: 140.0000 mg | PREFILLED_SYRINGE | SUBCUTANEOUS | 11 refills | Status: DC

## 2019-03-03 NOTE — Progress Notes (Signed)
Patient is here for follow up visit.  Subjective:   _0  ID: Kristine Newman, female    DOB: 1975/02/23, 44 y.o.   MRN: [id number]  Chief Complaint  Patient presents with  . Coronary Artery Disease  . Follow-up  . Results    lab    HPI   44 year old African-American female with hypertension, hyperlipidemia, prediabetes, CAD (NSTEMI, mid RCA and LAD/DIag PCI in 2018), family history of premature CAD.  Patient is doing well with regular exercise and activity. She does not have any angina symptoms with physical activity. She reports episodes of sharp chest pain at rest, lasting only for a few seconds.   Reviewed recent lipid panel with the patient.  Patient reports that she has not been losing weight, in spite of diet changes and regular exercise.   Current Outpatient Medications on File Prior to Visit  Medication Sig Dispense Refill  . acetaminophen (TYLENOL) 500 MG tablet Take 500 mg every 6 (six) hours as needed by mouth for mild pain.    Marland Kitchen aspirin EC 81 MG tablet Take 81 mg by mouth daily.    Marland Kitchen atorvastatin (LIPITOR) 80 MG tablet Take 1 tablet (80 mg total) by mouth daily at 6 PM. 90 tablet 3  . clopidogrel (PLAVIX) 75 MG tablet Take 1 tablet (75 mg total) by mouth daily. 90 tablet 3  . fluticasone (FLONASE) 50 MCG/ACT nasal spray Place 2 sprays daily as needed into both nostrils for allergies.     Marland Kitchen guaiFENesin (MUCINEX) 600 MG 12 hr tablet Take 600 mg by mouth 2 (two) times daily as needed for to loosen phlegm.    Marland Kitchen ipratropium (ATROVENT) 0.06 % nasal spray Place 2 sprays into the nose daily as needed for rhinitis.   5  . lisinopril (PRINIVIL,ZESTRIL) 10 MG tablet Take 1 tablet (10 mg total) by mouth daily. 90 tablet 3  . metoprolol succinate (TOPROL-XL) 50 MG 24 hr tablet Take 1 tablet (50 mg total) by mouth daily. Take with or immediately following a meal. 90 tablet 3  . nitroGLYCERIN (NITROSTAT) 0.4 MG SL tablet Place 1 tablet (0.4 mg total) under the tongue every 5 (five)  minutes as needed for chest pain. 30 tablet 3  . pantoprazole (PROTONIX) 40 MG tablet Take 40 mg by mouth daily as needed.     Marland Kitchen PROAIR HFA 108 (90 Base) MCG/ACT inhaler Take 2 puffs by mouth every 4 (four) hours as needed for wheezing or shortness of breath.  0  . sertraline (ZOLOFT) 50 MG tablet Take 50 mg by mouth daily.    . Vitamin D, Ergocalciferol, (DRISDOL) 50000 units CAPS capsule Take 50,000 Units by mouth every Tuesday.   0   No current facility-administered medications on file prior to visit.    Cardiovascular studies:  Sinus rhythm 62 bpm. Normal EKG.  Hospital echocardiogram 11/16/2016: - Left ventricle: The cavity size was normal. Systolic function was   normal. The estimated ejection fraction was in the range of 55%   to 60%. Mild hypokinesis of the basalinferolateral myocardium.   Left ventricular diastolic function parameters were normal. - No significant valvular abnormality  Coronary angiography/intervention 11/16/2016 and 11/18/2016: Severe LAD diagonal bifurcation 90% stenosis.   Moderate to severe proximal to mid RCA stenosis Resolute Onyx 3.0 X 30 mm mid LAD Synergy 2.5 X 20 mm Diag Synergy 3.0 X 32 mm prox-mid RCA  Labs:  02/23/2019: Glucose 100, BUN/Cr 11/0.86. EGFR 94. Na/K 138/4.5. Rest of the CMP normal Chol 141,  TG 151, HDL 38, LDL 77  Lipid panel 08/26/2017: Cholesterol 144, triglycerides 95, HDL 44, LDL 81  Labs 02/23/2017: Cholesterol 122, triglycerides 215, HDL 35, LDL 44  Lipid profile 11/16/2016: Cholesterol 275, LDL 190, HDL 45, triglyceride 199.   Review of Systems  Cardiovascular: Positive for chest pain (As per HPI). Negative for dyspnea on exertion, leg swelling, palpitations and syncope.       Objective:   Vitals:   03/03/19 1430  BP: 125/73  Pulse: 63  Temp: 97.8 F (36.6 C)  SpO2: 100%     Physical Exam  Constitutional: She appears well-developed and well-nourished.  Neck: No JVD present.  Cardiovascular: Normal  rate, regular rhythm, normal heart sounds and intact distal pulses.  No murmur heard. Pulmonary/Chest: Effort normal and breath sounds normal. She has no wheezes. She has no rales.  Musculoskeletal:        General: No edema.  Nursing note and vitals reviewed.       Assessment & Recommendations:   44 year old African-American female with hypertension, hyperlipidemia, prediabetes, CAD (NSTEMI, mid RCA and LAD/DIag PCI in 2018), family history of premature CAD.  CAD: Stable. No angina symptoms. Her sharp chest pain at rest lasting for few seconds is nonanginal.   In absence of any bleeding, I would recommend continuing DAPT up to 3 years post PCI (till October 2021). Continue metoprolol, lisinopril. Continue Lipitor 80 mg daily.  LDL down from 190 in 2018, down to 77.  However, given her history of CAD at young age, I would strive for further lower LDL numbers.  I discussed further dietary changes, Zetia, or PCSK9 inhibitor addition.  She would like to proceed with initiation of PCSK9 inhibitor.  Will prescribe Repatha 140 mg every 14 days.  Hypertension: Well controlled.  Hyperlipidemia: As above  Patient will receive Repatha injection in office every 2 weeks as she is not comfortable self administration at home.  Will repeat lipid panel in 6 months.  Follow-up in October 2021.  At that time, I will consider stopping Plavix.  Nigel Mormon, MD [facility] [place] Pager: [phone] Office: [phone] If no answer Cell [phone]

## 2019-03-04 DIAGNOSIS — E782 Mixed hyperlipidemia: Secondary | ICD-10-CM | POA: Insufficient documentation

## 2019-03-23 ENCOUNTER — Telehealth: Payer: Self-pay

## 2019-03-23 NOTE — Telephone Encounter (Signed)
Pt called to inform us that she is having dental work and was told if she could stop her plavix 75 3 days before the procedure. Please advise

## 2019-03-23 NOTE — Telephone Encounter (Signed)
Recommend stopping plavix 5 days before the procedure. Kristine Newman, please send pre-op letter.  Thanks MJP

## 2019-03-27 NOTE — Telephone Encounter (Signed)
Pt made aware.//ah

## 2019-05-18 ENCOUNTER — Ambulatory Visit: Payer: BC Managed Care – PPO | Attending: Family

## 2019-05-18 DIAGNOSIS — Z23 Encounter for immunization: Secondary | ICD-10-CM

## 2019-05-18 NOTE — Progress Notes (Signed)
   Covid-19 Vaccination Clinic  Name:  Kristine Newman    MRN: [id number] DOB: 10-09-1975  05/18/2019  [name] was observed post Covid-19 immunization for 15 minutes without incident. She was provided with Vaccine Information Sheet and instruction to access the V-Safe system.   [name] was instructed to call 911 with any severe reactions post vaccine: Marland Kitchen Difficulty breathing  . Swelling of face and throat  . A fast heartbeat  . A bad rash all over body  . Dizziness and weakness   Immunizations Administered    Name Date Dose VIS Date Route   Moderna COVID-19 Vaccine 05/18/2019  2:57 PM 0.5 mL 12/2018 Intramuscular   Manufacturer: Moderna   Lot: [id number]   NDC: 70761-518-34

## 2019-06-08 ENCOUNTER — Other Ambulatory Visit: Payer: Self-pay | Admitting: Cardiology

## 2019-06-11 ENCOUNTER — Other Ambulatory Visit: Payer: Self-pay | Admitting: Cardiology

## 2019-06-13 ENCOUNTER — Ambulatory Visit: Payer: BC Managed Care – PPO | Attending: Family

## 2019-06-13 DIAGNOSIS — Z23 Encounter for immunization: Secondary | ICD-10-CM

## 2019-06-13 NOTE — Progress Notes (Signed)
   Covid-19 Vaccination Clinic  Name:  Kristine Newman    MRN: [id number] DOB: 29-Dec-1975  06/13/2019  Kristine Newman was observed post Covid-19 immunization for 15 minutes without incident. She was provided with Vaccine Information Sheet and instruction to access the V-Safe system.   Kristine Newman was instructed to call 911 with any severe reactions post vaccine: Marland Kitchen Difficulty breathing  . Swelling of face and throat  . A fast heartbeat  . A bad rash all over body  . Dizziness and weakness   Immunizations Administered    Name Date Dose VIS Date Route   Moderna COVID-19 Vaccine 06/13/2019  1:20 PM 0.5 mL 12/2018 Intramuscular   Manufacturer: Moderna   Lot: [id number]   NDC: 20802-233-61

## 2019-08-09 ENCOUNTER — Other Ambulatory Visit: Payer: Self-pay | Admitting: Cardiology

## 2019-08-09 DIAGNOSIS — I2510 Atherosclerotic heart disease of native coronary artery without angina pectoris: Secondary | ICD-10-CM

## 2019-09-05 ENCOUNTER — Ambulatory Visit (HOSPITAL_COMMUNITY)
Admission: RE | Admit: 2019-09-05 | Discharge: 2019-09-05 | Disposition: A | Discharge status: Home / Self Care | Payer: BC Managed Care – PPO | Source: Ambulatory Visit | Attending: Family Medicine | Admitting: Family Medicine

## 2019-09-05 ENCOUNTER — Telehealth (HOSPITAL_COMMUNITY): Payer: Self-pay

## 2019-09-05 ENCOUNTER — Encounter (HOSPITAL_COMMUNITY): Payer: Self-pay

## 2019-09-05 ENCOUNTER — Other Ambulatory Visit: Payer: Self-pay

## 2019-09-05 VITALS — BP 123/76 | HR 60 | Temp 98.3°F | Resp 18

## 2019-09-05 DIAGNOSIS — E119 Type 2 diabetes mellitus without complications: Secondary | ICD-10-CM

## 2019-09-05 DIAGNOSIS — I252 Old myocardial infarction: Secondary | ICD-10-CM

## 2019-09-05 DIAGNOSIS — Z711 Person with feared health complaint in whom no diagnosis is made: Secondary | ICD-10-CM

## 2019-09-05 NOTE — Telephone Encounter (Signed)
Called and left message for pt concerning her appt this morning. When pt calls back, will advise that we cannot give nebulizer tx here at [facility], but we can see/eval her for possible MDI, xray, etc if pt would still like to be evaluated at [facility]. Left message on pt's voicemail only indicating RN name, UCC, and request to call us back.

## 2019-09-05 NOTE — Discharge Instructions (Signed)
Please make sure you follow up with your cardiologist before the 1 year time frame. Also check back with your PCP. I do not see signs of a heart attack. Sometimes you having the sensation to take a deep breath can be related to your heart as opposed to your lungs. On exam, your lungs are very clear and without a history of breathing conditions or smoking, an albuterol inhaler likely will not help. But please make sure you follow up with your PCP.

## 2019-09-05 NOTE — ED Triage Notes (Signed)
Pt states she has been feeling the need to take purposeful deep breaths intermittently for approx 3 weeks, while at rest or with physical activity. Pt doesn't classify need for 'deep breath' as SOB. Also reports intermittent central chest "tightness" that is not associated with need for deep breath. Pt also reports recent increase in stress.  Pt states she had MI three years ago.  Denies radiating pain/tightness to jaw, back/arm/neck, diaphoresis, n/v, dizziness, or other complaint. Denies CP or tightness at present. Pt states congestion, non-productive onset yesterday, fever, sore throat.  Pt able to speak full sentences w/o difficulty.  Pt states she formerly had been Rx MDI inhaler, but hasn't had Rx for several years.  Bilateral lung sounds CTA, S1S2 RRR.  Pt took a deep breath during triage and asked, "did you see, I had to take one just now." When asked what pt was thinking while "needing to take deep breath", she replied that she was concerned about the EKG.  EKG performed and results given to M. Marquita Palms.

## 2019-09-05 NOTE — ED Provider Notes (Signed)
[facility]   MRN: [id number] DOB: [birth year]  Subjective:   Kristine Newman is a 44 y.o. female presenting for 3-week history of persistent intermittent random episodes of sensation of needing to take a big deep breath.  Patient states that episodes occur randomly whether she is doing something more at rest.  Denies any chest pain of breath or actual shortness of breath, cough, fever, loss of sense of taste and smell.  Patient is worried given her history of a heart attack or diabetes.  She has worked really hard to make sure she avoids a recurrence.  She has a cardiologist that she follows up with regularly, was doing well and advised to have 1 year follow-ups and is now due in October.  She previously had a similar episode with her PCP, had gotten a breathing treatment which she states helped her.  Denies history of asthma, COPD, smoking.  No current facility-administered medications for this encounter.  Current Outpatient Medications:  .  aspirin EC 81 MG tablet, Take 81 mg by mouth daily., Disp: , Rfl:  .  atorvastatin (LIPITOR) 80 MG tablet, Take 1 tablet (80 mg total) by mouth daily at 6 PM., Disp: 90 tablet, Rfl: 3 .  cetirizine (ZYRTEC) 10 MG tablet, Take 1 tablet by mouth daily as needed., Disp: , Rfl:  .  Chlorpheniramine-Acetaminophen (CORICIDIN HBP COLD/FLU PO), Take by mouth as needed., Disp: , Rfl:  .  clopidogrel (PLAVIX) 75 MG tablet, TAKE 1 TABLET BY MOUTH EVERY DAY, Disp: 90 tablet, Rfl: 3 .  lisinopril (ZESTRIL) 10 MG tablet, TAKE 1 TABLET BY MOUTH EVERY DAY, Disp: 90 tablet, Rfl: 3 .  metoprolol succinate (TOPROL-XL) 50 MG 24 hr tablet, TAKE 1 TABLET (50 MG TOTAL) BY MOUTH DAILY. TAKE WITH OR IMMEDIATELY FOLLOWING A MEAL., Disp: 90 tablet, Rfl: 3 .  sertraline (ZOLOFT) 50 MG tablet, Take 50 mg by mouth daily., Disp: , Rfl:  .  acetaminophen (TYLENOL) 500 MG tablet, Take 500 mg every 6 (six) hours as needed by mouth for mild pain., Disp: , Rfl:  .  fluticasone (FLONASE)  50 MCG/ACT nasal spray, Place 2 sprays daily as needed into both nostrils for allergies. , Disp: , Rfl:  .  nitroGLYCERIN (NITROSTAT) 0.4 MG SL tablet, Place 1 tablet (0.4 mg total) under the tongue every 5 (five) minutes as needed for chest pain., Disp: 30 tablet, Rfl: 3 .  PRALUENT 75 MG/ML SOAJ, Please specify directions, refills and quantity, Disp: 6 pen, Rfl: 3 .  PROAIR HFA 108 (90 Base) MCG/ACT inhaler, Take 2 puffs by mouth every 4 (four) hours as needed for wheezing or shortness of breath., Disp: , Rfl: 0 .  Vitamin D, Ergocalciferol, (DRISDOL) 50000 units CAPS capsule, Take 50,000 Units by mouth every Tuesday. , Disp: , Rfl: 0   Allergies  Allergen Reactions  . Crestor [Rosuvastatin]     "BODY ACHES"    Past Medical History:  Diagnosis Date  . Anxiety   . High cholesterol   . Hypertension   . NSTEMI (non-ST elevated myocardial infarction) (HCC) 11/15/2016  . Pre-diabetes      Past Surgical History:  Procedure Laterality Date  . CORONARY ANGIOPLASTY WITH STENT PLACEMENT    . CORONARY BALLOON ANGIOPLASTY N/A 11/16/2016   Procedure: CORONARY BALLOON ANGIOPLASTY;  Surgeon: Elder Negus, MD;  [facility];  Service: Cardiovascular;  Laterality: N/A;  . CORONARY STENT INTERVENTION N/A 11/16/2016   Procedure: CORONARY STENT INTERVENTION;  Surgeon: Elder Negus,  MD;  [facility];  Service: Cardiovascular;  Laterality: N/A;  . CORONARY STENT INTERVENTION N/A 11/18/2016   Procedure: CORONARY STENT INTERVENTION;  Surgeon: Elder Negus, MD;  [facility];  Service: Cardiovascular;  Laterality: N/A;  . LEFT HEART CATH AND CORONARY ANGIOGRAPHY N/A 11/16/2016   Procedure: LEFT HEART CATH AND CORONARY ANGIOGRAPHY;  Surgeon: Elder Negus, MD;  [facility];  Service: Cardiovascular;  Laterality: N/A;    Family History  Problem Relation Age of Onset  . Breast cancer Maternal Aunt   .  Hypertension Mother   . Diabetes Mother   . Hyperlipidemia Father   . Hypertension Father     Social History   Tobacco Use  . Smoking status: Never Smoker  . Smokeless tobacco: Never Used  Vaping Use  . Vaping Use: Never used  Substance Use Topics  . Alcohol use: Yes    Alcohol/week: 1.0 standard drink    Types: 1 Shots of liquor per week    Comment: occasional   . Drug use: No    ROS   Objective:   Vitals: BP 123/76 (BP Location: Right Arm)   Pulse 60   Temp 98.3 F (36.8 C) (Oral)   Resp 18   SpO2 100%   Physical Exam Constitutional:      General: She is not in acute distress.    Appearance: Normal appearance. She is well-developed. She is not ill-appearing, toxic-appearing or diaphoretic.  HENT:     Head: Normocephalic and atraumatic.     Nose: Nose normal.     Mouth/Throat:     Mouth: Mucous membranes are moist.  Eyes:     Extraocular Movements: Extraocular movements intact.     Pupils: Pupils are equal, round, and reactive to light.  Cardiovascular:     Rate and Rhythm: Normal rate and regular rhythm.     Pulses: Normal pulses.     Heart sounds: Normal heart sounds. No murmur heard.  No friction rub. No gallop.   Pulmonary:     Effort: Pulmonary effort is normal. No respiratory distress.     Breath sounds: Normal breath sounds. No stridor. No wheezing, rhonchi or rales.  Skin:    General: Skin is warm and dry.     Findings: No rash.  Neurological:     Mental Status: She is alert and oriented to person, place, and time.  Psychiatric:        Mood and Affect: Mood normal.        Behavior: Behavior normal.        Thought Content: Thought content normal.     ED ECG REPORT   Date: 09/05/2019  Rate: 62bpm  Rhythm: normal sinus rhythm  QRS Axis: normal  Intervals: normal  ST/T Wave abnormalities: nonspecific T wave changes  Conduction Disutrbances:none  Narrative Interpretation: Sinus rhythm at 62bpm with non-specific T wave flattening in Lead  II, aVF, V3. No acute changes.   Old EKG Reviewed: Unchanged.  I have personally reviewed the EKG tracing and agree with the computerized printout as noted.   Assessment and Plan :   PDMP not reviewed this encounter.  1. Physically well but worried   2. History of MI (myocardial infarction)   3. Well controlled diabetes mellitus [facility])     Patient has excellent physical exam findings and vital signs.  Low suspicion for pulmonary source of her symptoms.  Given her heart rate history, recommended she follow-up with  her cardiologist prior to the 1 year follow-up.  At this time I have low suspicion for COVID-19 given lack of respiratory symptoms and sick symptoms.  EKG from today has no acute changes in his unchanged from previous one. Counseled patient on potential for adverse effects with medications prescribed/recommended today, ER and return-to-clinic precautions discussed, patient verbalized understanding.    Wallis Bamberg, PA-C 09/05/19 1155

## 2019-09-15 ENCOUNTER — Encounter: Payer: Self-pay | Admitting: Cardiology

## 2019-09-15 ENCOUNTER — Other Ambulatory Visit: Payer: Self-pay

## 2019-09-15 ENCOUNTER — Ambulatory Visit: Payer: BC Managed Care – PPO | Admitting: Cardiology

## 2019-09-15 VITALS — BP 117/75 | HR 67 | Resp 15 | Ht 63.0 in | Wt 200.0 lb

## 2019-09-15 DIAGNOSIS — R0602 Shortness of breath: Secondary | ICD-10-CM

## 2019-09-15 DIAGNOSIS — R0789 Other chest pain: Secondary | ICD-10-CM

## 2019-09-15 DIAGNOSIS — I25118 Atherosclerotic heart disease of native coronary artery with other forms of angina pectoris: Secondary | ICD-10-CM

## 2019-09-15 DIAGNOSIS — I2510 Atherosclerotic heart disease of native coronary artery without angina pectoris: Secondary | ICD-10-CM | POA: Insufficient documentation

## 2019-09-15 NOTE — Progress Notes (Signed)
Patient is here for follow up visit.  Subjective:   '@Patient'  ID: Kristine Newman, female    DOB: March 29, 1975, 44 y.o.   MRN: [id number]  Chief Complaint  Patient presents with  . Follow-up    6 month  . Shortness of Breath    44 year old African-American female with hypertension, hyperlipidemia, prediabetes, CAD  Patient has recently had episodes of "having to breath deep" multiple times through the day. She has also had episodes of retrosternal chest pain lasting for few seconds.   She never got started on PCSK9 inhibitors.    Current Outpatient Medications on File Prior to Visit  Medication Sig Dispense Refill  . acetaminophen (TYLENOL) 500 MG tablet Take 500 mg every 6 (six) hours as needed by mouth for mild pain.    Marland Kitchen aspirin EC 81 MG tablet Take 81 mg by mouth daily.    Marland Kitchen atorvastatin (LIPITOR) 80 MG tablet Take 1 tablet (80 mg total) by mouth daily at 6 PM. 90 tablet 3  . cetirizine (ZYRTEC) 10 MG tablet Take 1 tablet by mouth daily as needed.    . Chlorpheniramine-Acetaminophen (CORICIDIN HBP COLD/FLU PO) Take by mouth as needed.    . clopidogrel (PLAVIX) 75 MG tablet TAKE 1 TABLET BY MOUTH EVERY DAY 90 tablet 3  . fluticasone (FLONASE) 50 MCG/ACT nasal spray Place 2 sprays daily as needed into both nostrils for allergies.     Marland Kitchen lisinopril (ZESTRIL) 10 MG tablet TAKE 1 TABLET BY MOUTH EVERY DAY 90 tablet 3  . metoprolol succinate (TOPROL-XL) 50 MG 24 hr tablet TAKE 1 TABLET (50 MG TOTAL) BY MOUTH DAILY. TAKE WITH OR IMMEDIATELY FOLLOWING A MEAL. 90 tablet 3  . nitroGLYCERIN (NITROSTAT) 0.4 MG SL tablet Place 1 tablet (0.4 mg total) under the tongue every 5 (five) minutes as needed for chest pain. 30 tablet 3  . PROAIR HFA 108 (90 Base) MCG/ACT inhaler Take 2 puffs by mouth every 4 (four) hours as needed for wheezing or shortness of breath. Pt needs a refill.  0  . sertraline (ZOLOFT) 50 MG tablet Take 50 mg by mouth daily.    . Vitamin D, Ergocalciferol, (DRISDOL) 50000 units  CAPS capsule Take 50,000 Units by mouth every Tuesday.   0  . PRALUENT 75 MG/ML SOAJ Please specify directions, refills and quantity (Patient not taking: Please specify directions, refills and quantity) 6 pen 3   No current facility-administered medications on file prior to visit.    Cardiovascular studies:  EKG 09/15/2019: Sinus rhythm 63 bpm Possible old anteroseptal infarct Poor R wave progression  Hospital echocardiogram 11/16/2016: - Left ventricle: The cavity size was normal. Systolic function was   normal. The estimated ejection fraction was in the range of 55%   to 60%. Mild hypokinesis of the basalinferolateral myocardium.   Left ventricular diastolic function parameters were normal. - No significant valvular abnormality  Coronary angiography/intervention 11/16/2016 and 11/18/2016: Severe LAD diagonal bifurcation 90% stenosis.   Moderate to severe proximal to mid RCA stenosis Resolute Onyx 3.0 X 30 mm mid LAD Synergy 2.5 X 20 mm Diag Synergy 3.0 X 32 mm prox-mid RCA  Labs:  02/23/2019: Glucose 100, BUN/Cr 11/0.86. EGFR 94. Na/K 138/4.5. Rest of the CMP normal Chol 141, TG 151, HDL 38, LDL 77  Lipid panel 08/26/2017: Cholesterol 144, triglycerides 95, HDL 44, LDL 81  Labs 02/23/2017: Cholesterol 122, triglycerides 215, HDL 35, LDL 44  Lipid profile 11/16/2016: Cholesterol 275, LDL 190, HDL 45, triglyceride 199.   Review  of Systems  Cardiovascular: Positive for chest pain (As per HPI). Negative for dyspnea on exertion, leg swelling, palpitations and syncope.  Respiratory: Positive for shortness of breath.        Objective:   Vitals:   09/15/19 1402  BP: 117/75  Pulse: 67  Resp: 15  SpO2: 98%     Physical Exam Vitals and nursing note reviewed.  Constitutional:      Appearance: She is well-developed.  Neck:     Vascular: No JVD.  Cardiovascular:     Rate and Rhythm: Normal rate and regular rhythm.     Pulses: Intact distal pulses.     Heart sounds:  Normal heart sounds. No murmur heard.   Pulmonary:     Effort: Pulmonary effort is normal.     Breath sounds: Normal breath sounds. No wheezing or rales.         Assessment & Recommendations:   43 year old African-American female with hypertension, hyperlipidemia, prediabetes, CAD  CAD: S/p multivessel PCI for NSTEMI 08/2016. Atypical angina and dyspnea symptoms. Will obtain exercise nuclear stress test Continue DAPT for now. If stress test normal, will discontinue plavix.  Continue metoprolol, lisinopril, lipitor.  Will check lipid panel, lipoprotein a. If LDL remains >70, will start Reptha  Hypertension: Well controlled.  Hyperlipidemia: As above  F/u in 4 weeks  Hassaan Crite Esther Hardy, MD [facility]. [place] Pager: [phone] Office: [phone] If no answer Cell [phone]

## 2019-09-21 ENCOUNTER — Other Ambulatory Visit: Payer: Self-pay

## 2019-09-21 ENCOUNTER — Other Ambulatory Visit: Payer: Self-pay | Admitting: Family Medicine

## 2019-09-21 ENCOUNTER — Ambulatory Visit
Admission: RE | Admit: 2019-09-21 | Discharge: 2019-09-21 | Disposition: A | Discharge status: Home / Self Care | Payer: BC Managed Care – PPO | Source: Ambulatory Visit | Attending: Family Medicine | Admitting: Family Medicine

## 2019-09-21 DIAGNOSIS — R0789 Other chest pain: Secondary | ICD-10-CM

## 2019-09-27 LAB — LIPOPROTEIN A (LPA): Lipoprotein (a): 80.8 nmol/L — ABNORMAL HIGH (ref <75.0)

## 2019-10-02 ENCOUNTER — Other Ambulatory Visit: Payer: Self-pay

## 2019-10-02 ENCOUNTER — Ambulatory Visit: Payer: BC Managed Care – PPO

## 2019-10-02 DIAGNOSIS — R0602 Shortness of breath: Secondary | ICD-10-CM

## 2019-10-09 ENCOUNTER — Other Ambulatory Visit: Payer: Self-pay

## 2019-10-09 ENCOUNTER — Ambulatory Visit: Payer: BC Managed Care – PPO | Admitting: Cardiology

## 2019-10-09 ENCOUNTER — Encounter: Payer: Self-pay | Admitting: Cardiology

## 2019-10-09 VITALS — BP 136/63 | HR 62 | Resp 16 | Ht 63.0 in | Wt 198.8 lb

## 2019-10-09 DIAGNOSIS — E1159 Type 2 diabetes mellitus with other circulatory complications: Secondary | ICD-10-CM

## 2019-10-09 DIAGNOSIS — I10 Essential (primary) hypertension: Secondary | ICD-10-CM

## 2019-10-09 DIAGNOSIS — E782 Mixed hyperlipidemia: Secondary | ICD-10-CM

## 2019-10-09 DIAGNOSIS — I25118 Atherosclerotic heart disease of native coronary artery with other forms of angina pectoris: Secondary | ICD-10-CM

## 2019-10-09 DIAGNOSIS — R0602 Shortness of breath: Secondary | ICD-10-CM

## 2019-10-09 MED ORDER — ISOSORBIDE MONONITRATE ER 30 MG PO TB24: 30.0000 mg | ORAL_TABLET | Freq: Every day | ORAL | 3 refills | Status: DC

## 2019-10-09 NOTE — Progress Notes (Signed)
Patient is here for follow up visit.  Subjective:   '@Patient'  ID: Kristine Newman, female    DOB: 06-24-1975, 44 y.o.   MRN: [id number]  Chief Complaint  Patient presents with  . Coronary Artery Disease  . Follow-up  . Results    stress test    44 year old African-American female with hypertension, hyperlipidemia, prediabetes, CAD  Patient is continued to have occasional exertional shortness of breath, and pleuritic chest pain mostly at rest.  Recent stress test showed mild ischemia, details below.   Current Outpatient Medications on File Prior to Visit  Medication Sig Dispense Refill  . acetaminophen (TYLENOL) 500 MG tablet Take 500 mg every 6 (six) hours as needed by mouth for mild pain.    Marland Kitchen aspirin EC 81 MG tablet Take 81 mg by mouth daily.    Marland Kitchen atorvastatin (LIPITOR) 80 MG tablet Take 1 tablet (80 mg total) by mouth daily at 6 PM. 90 tablet 3  . cetirizine (ZYRTEC) 10 MG tablet Take 1 tablet by mouth daily as needed.    . Chlorpheniramine-Acetaminophen (CORICIDIN HBP COLD/FLU PO) Take by mouth as needed.    . clopidogrel (PLAVIX) 75 MG tablet TAKE 1 TABLET BY MOUTH EVERY DAY 90 tablet 3  . fluticasone (FLONASE) 50 MCG/ACT nasal spray Place 2 sprays daily as needed into both nostrils for allergies.     Marland Kitchen lisinopril (ZESTRIL) 10 MG tablet TAKE 1 TABLET BY MOUTH EVERY DAY 90 tablet 3  . metoprolol succinate (TOPROL-XL) 50 MG 24 hr tablet TAKE 1 TABLET (50 MG TOTAL) BY MOUTH DAILY. TAKE WITH OR IMMEDIATELY FOLLOWING A MEAL. 90 tablet 3  . nitroGLYCERIN (NITROSTAT) 0.4 MG SL tablet Place 1 tablet (0.4 mg total) under the tongue every 5 (five) minutes as needed for chest pain. 30 tablet 3  . PRALUENT 75 MG/ML SOAJ Please specify directions, refills and quantity (Patient not taking: Please specify directions, refills and quantity) 6 pen 3  . PROAIR HFA 108 (90 Base) MCG/ACT inhaler Take 2 puffs by mouth every 4 (four) hours as needed for wheezing or shortness of breath. Pt needs a  refill.  0  . sertraline (ZOLOFT) 50 MG tablet Take 50 mg by mouth daily.    . Vitamin D, Ergocalciferol, (DRISDOL) 50000 units CAPS capsule Take 50,000 Units by mouth every Tuesday.   0   No current facility-administered medications on file prior to visit.    Cardiovascular studies:  Lexiscan Sestamibi Stress Test 10/02/2019: Patient attempted treadmill exercise stress test, however the stress test was converted to [place] at 7 minutes due to chest pain and inability to achieve target heart rate.  Patient exercised for 7: 07 minutes and achieved 8.76 METS.  Normal blood pressure response.  Resting EKG normal sinus rhythm with nonspecific T flattening.  Stress EKG reveals inferior and lateral T wave inversion.  Stress EKG overall is non-diagnostic. Mild degree medium extent perfusion defect consistent with mild (reversible) ischemia located in the mid inferoseptal wall and basal inferoseptal wall (Right Coronary Artery region) of left ventricle. Overall LV systolic function is normal without regional wall motion abnormalities. Stress LV EF: 56%.  No previous exam available for comparison. Intermediate risk study. The defect size is small and ischemia is very mild.    EKG 09/15/2019: Sinus rhythm 63 bpm Possible old anteroseptal infarct Poor R wave progression  Hospital echocardiogram 11/16/2016: - Left ventricle: The cavity size was normal. Systolic function was   normal. The estimated ejection fraction was in the range  of 55%   to 60%. Mild hypokinesis of the basalinferolateral myocardium.   Left ventricular diastolic function parameters were normal. - No significant valvular abnormality  Coronary angiography/intervention 11/16/2016 and 11/18/2016: Severe LAD diagonal bifurcation 90% stenosis.   Moderate to severe proximal to mid RCA stenosis Resolute Onyx 3.0 X 30 mm mid LAD Synergy 2.5 X 20 mm Diag Synergy 3.0 X 32 mm prox-mid RCA  Labs:  09/26/2019: Lipoprotein (a):  80.8  02/23/2019: Glucose 100, BUN/Cr 11/0.86. EGFR 94. Na/K 138/4.5. Rest of the CMP normal Chol 141, TG 151, HDL 38, LDL 77  Lipid panel 08/26/2017: Cholesterol 144, triglycerides 95, HDL 44, LDL 81  Labs 02/23/2017: Cholesterol 122, triglycerides 215, HDL 35, LDL 44  Lipid profile 11/16/2016: Cholesterol 275, LDL 190, HDL 45, triglyceride 199.   Review of Systems  Cardiovascular: Positive for chest pain (As per HPI). Negative for dyspnea on exertion, leg swelling, palpitations and syncope.  Respiratory: Positive for shortness of breath.        Objective:    Vitals:   10/09/19 0855  BP: 136/63  Pulse: 62  Resp: 16  SpO2: 99%     Physical Exam Vitals and nursing note reviewed.  Constitutional:      Appearance: She is well-developed.  Neck:     Vascular: No JVD.  Cardiovascular:     Rate and Rhythm: Normal rate and regular rhythm.     Pulses: Intact distal pulses.     Heart sounds: Normal heart sounds. No murmur heard.   Pulmonary:     Effort: Pulmonary effort is normal.     Breath sounds: Normal breath sounds. No wheezing or rales.         Assessment & Recommendations:   44 year old African-American female with hypertension, hyperlipidemia, prediabetes, CAD  CAD: S/p multivessel PCI for NSTEMI 08/2016. Stress test in 09/2019 showed mild inferior ischemia.  No high risk findings on stress test. I remain skeptical about the nature of her symptoms not being anginal. Nonetheless, I will start Imdur 30 mg daily, obtain echocardiogram and see her back in 4 weeks.  If symptoms persist, could consider coronary angiography and possible intervention. Continue DAPT for now.  Continue metoprolol, lisinopril, lipitor.  Will check lipid panel, lipoprotein a. If LDL remains >70, will start Reptha  Hypertension: Well controlled.  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 [place] Pager: [phone] Office: [phone] If no answer Cell [phone]

## 2019-10-12 ENCOUNTER — Other Ambulatory Visit: Payer: Self-pay

## 2019-10-12 ENCOUNTER — Ambulatory Visit: Payer: BC Managed Care – PPO

## 2019-10-12 DIAGNOSIS — I25118 Atherosclerotic heart disease of native coronary artery with other forms of angina pectoris: Secondary | ICD-10-CM

## 2019-10-12 DIAGNOSIS — R0602 Shortness of breath: Secondary | ICD-10-CM

## 2019-10-16 NOTE — Progress Notes (Signed)
No answer left a vm, will try again later sch/rma

## 2019-10-20 ENCOUNTER — Ambulatory Visit: Payer: BC Managed Care – PPO | Admitting: Cardiology

## 2019-11-04 NOTE — Progress Notes (Signed)
Patient is here for follow up visit.  Subjective:   '@Patient'  ID: Kristine Newman, female    DOB: 09/05/1975, 44 y.o.   MRN: [id number]  Chief Complaint  Patient presents with   Coronary Artery Disease   Follow-up    46 week    44 year old African-American female with hypertension, hyperlipidemia, prediabetes, CAD  Patient is doing well.  She is not had any anginal episodes recently.  She is walking 30 minutes 3 times a week.  She is compliant with medical therapy, including recently started Imdur 30 mg daily.  She is also seeing her PCP for anxiety.  Reviewed recent lipid panel with the patient, details below.  Current Outpatient Medications on File Prior to Visit  Medication Sig Dispense Refill   acetaminophen (TYLENOL) 500 MG tablet Take 500 mg every 6 (six) hours as needed by mouth for mild pain.     aspirin EC 81 MG tablet Take 81 mg by mouth daily.     atorvastatin (LIPITOR) 80 MG tablet Take 1 tablet (80 mg total) by mouth daily at 6 PM. 90 tablet 3   cetirizine (ZYRTEC) 10 MG tablet Take 1 tablet by mouth daily as needed.     Chlorpheniramine-Acetaminophen (CORICIDIN HBP COLD/FLU PO) Take by mouth as needed.     clopidogrel (PLAVIX) 75 MG tablet TAKE 1 TABLET BY MOUTH EVERY DAY 90 tablet 3   fluticasone (FLONASE) 50 MCG/ACT nasal spray Place 2 sprays daily as needed into both nostrils for allergies.      isosorbide mononitrate (IMDUR) 30 MG 24 hr tablet Take 1 tablet (30 mg total) by mouth daily. 30 tablet 3   lisinopril (ZESTRIL) 10 MG tablet TAKE 1 TABLET BY MOUTH EVERY DAY 90 tablet 3   metoprolol succinate (TOPROL-XL) 50 MG 24 hr tablet TAKE 1 TABLET (50 MG TOTAL) BY MOUTH DAILY. TAKE WITH OR IMMEDIATELY FOLLOWING A MEAL. 90 tablet 3   nitroGLYCERIN (NITROSTAT) 0.4 MG SL tablet Place 1 tablet (0.4 mg total) under the tongue every 5 (five) minutes as needed for chest pain. 30 tablet 3   pantoprazole (PROTONIX) 40 MG tablet Take 40 mg by mouth daily.      PRALUENT 75 MG/ML SOAJ Please specify directions, refills and quantity 6 pen 3   sertraline (ZOLOFT) 50 MG tablet Take 50 mg by mouth daily.     Vitamin D, Ergocalciferol, (DRISDOL) 50000 units CAPS capsule Take 50,000 Units by mouth every Tuesday.   0   No current facility-administered medications on file prior to visit.    Cardiovascular studies:  Lexiscan Sestamibi Stress Test 10/02/2019: Patient attempted treadmill exercise stress test, however the stress test was converted to [place] at 7 minutes due to chest pain and inability to achieve target heart rate.  Patient exercised for 7: 07 minutes and achieved 8.76 METS.  Normal blood pressure response.  Resting EKG normal sinus rhythm with nonspecific T flattening.  Stress EKG reveals inferior and lateral T wave inversion.  Stress EKG overall is non-diagnostic. Mild degree medium extent perfusion defect consistent with mild (reversible) ischemia located in the mid inferoseptal wall and basal inferoseptal wall (Right Coronary Artery region) of left ventricle. Overall LV systolic function is normal without regional wall motion abnormalities. Stress LV EF: 56%.  No previous exam available for comparison. Intermediate risk study. The defect size is small and ischemia is very mild.    EKG 09/15/2019: Sinus rhythm 63 bpm Possible old anteroseptal infarct Poor R wave progression  Hospital echocardiogram 11/16/2016: -  Left ventricle: The cavity size was normal. Systolic function was   normal. The estimated ejection fraction was in the range of 55%   to 60%. Mild hypokinesis of the basalinferolateral myocardium.   Left ventricular diastolic function parameters were normal. - No significant valvular abnormality  Coronary angiography/intervention 11/16/2016 and 11/18/2016: Severe LAD diagonal bifurcation 90% stenosis.   Moderate to severe proximal to mid RCA stenosis Resolute Onyx 3.0 X 30 mm mid LAD Synergy 2.5 X 20 mm Diag Synergy 3.0 X  32 mm prox-mid RCA  Labs:  10/31/2019: Glucose 102. BUN/Cr 11/0.9., eGFR 82 Chol 162, TG 187, HDL 42, LDL 88 HbA1C 6.6%  09/26/2019: Lipoprotein (a): 80.8  02/23/2019: Glucose 100, BUN/Cr 11/0.86. EGFR 94. Na/K 138/4.5. Rest of the CMP normal Chol 141, TG 151, HDL 38, LDL 77  Lipid panel 08/26/2017: Cholesterol 144, triglycerides 95, HDL 44, LDL 81  Labs 02/23/2017: Cholesterol 122, triglycerides 215, HDL 35, LDL 44  Lipid profile 11/16/2016: Cholesterol 275, LDL 190, HDL 45, triglyceride 199.   Review of Systems  Cardiovascular: Positive for chest pain (As per HPI). Negative for dyspnea on exertion, leg swelling, palpitations and syncope.  Respiratory: Positive for shortness of breath.        Objective:    Vitals:   11/10/19 0938  BP: 129/67  Pulse: 60  Resp: 16  SpO2: 97%     Physical Exam Vitals and nursing note reviewed.  Constitutional:      Appearance: She is well-developed.  Neck:     Vascular: No JVD.  Cardiovascular:     Rate and Rhythm: Normal rate and regular rhythm.     Pulses: Intact distal pulses.     Heart sounds: Normal heart sounds. No murmur heard.   Pulmonary:     Effort: Pulmonary effort is normal.     Breath sounds: Normal breath sounds. No wheezing or rales.         Assessment & Recommendations:   44 year old African-American female with hypertension, hyperlipidemia, prediabetes, CAD  CAD: S/p multivessel PCI for NSTEMI 08/2016. Stress test in 09/2019 showed mild inferior ischemia.  No high risk findings on stress test. Her symptoms of chest pain, whether or not anginal, have improved on current medical therapy.  No indication for repeat coronary angiogram at this time. I will stop her aspirin and continue Plavix for long-term therapy, given her complex coronary artery disease history and risk factors. Continue metoprolol, lisinopril, lipitor.  Well-controlled LDL 88 on Lipitor 80. Will start [place]. Repatha administered today  in the clinic.   Hypertension: Well controlled.  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 [place] Pager: [phone] Office: [phone] If no answer Cell [phone]

## 2019-11-10 ENCOUNTER — Other Ambulatory Visit: Payer: Self-pay

## 2019-11-10 ENCOUNTER — Ambulatory Visit: Payer: BC Managed Care – PPO | Admitting: Cardiology

## 2019-11-10 ENCOUNTER — Other Ambulatory Visit: Payer: Self-pay | Admitting: Cardiology

## 2019-11-10 ENCOUNTER — Encounter: Payer: Self-pay | Admitting: Cardiology

## 2019-11-10 VITALS — BP 129/67 | HR 60 | Resp 16 | Ht 63.0 in | Wt 202.0 lb

## 2019-11-10 DIAGNOSIS — I25118 Atherosclerotic heart disease of native coronary artery with other forms of angina pectoris: Secondary | ICD-10-CM

## 2019-11-10 DIAGNOSIS — E1159 Type 2 diabetes mellitus with other circulatory complications: Secondary | ICD-10-CM

## 2019-11-10 DIAGNOSIS — E782 Mixed hyperlipidemia: Secondary | ICD-10-CM

## 2019-11-10 DIAGNOSIS — I10 Essential (primary) hypertension: Secondary | ICD-10-CM

## 2019-11-10 MED ORDER — EVOLOCUMAB 140 MG/ML ~~LOC~~ SOSY: 140.0000 mg | PREFILLED_SYRINGE | Freq: Once | SUBCUTANEOUS | Status: DC

## 2019-11-10 MED ORDER — REPATHA 140 MG/ML ~~LOC~~ SOSY: 140.0000 mg | PREFILLED_SYRINGE | SUBCUTANEOUS | 6 refills | Status: DC

## 2019-11-10 MED ORDER — EVOLOCUMAB 140 MG/ML ~~LOC~~ SOAJ
140.0000 mg | Freq: Once | SUBCUTANEOUS | Status: AC
  Administered 2019-11-10: 140 mg via SUBCUTANEOUS

## 2019-11-10 NOTE — Progress Notes (Signed)
Administration Action Time Recorded Time Documented By Site Comment Reason Patient Supplied  Given : 140 mg :  : Subcutaneous 11/10/19 1108 11/10/19 1109 Mares, Lesley Left Anterior Thigh   No     ICD-10-CM   1. Mixed hyperlipidemia  E78.2 Evolocumab SOAJ 140 mg     [name], MD, [facility] 11/10/2019, 11:53 AM Office: [phone] Pager: [phone]

## 2019-11-14 NOTE — Telephone Encounter (Signed)
Pharmacy comment: Alternative Requested:THE PRESCRIBED MEDICATION IS NOT COVERED BY INSURANCE. PLEASE CONSIDER CHANGING TO ONE OF THE SUGGESTED COVERED ALTERNATIVES.

## 2019-11-14 NOTE — Telephone Encounter (Signed)
Ok to send [employer]

## 2019-11-15 ENCOUNTER — Other Ambulatory Visit: Payer: Self-pay

## 2019-11-24 ENCOUNTER — Ambulatory Visit: Payer: BC Managed Care – PPO | Admitting: Cardiology

## 2019-11-24 ENCOUNTER — Other Ambulatory Visit: Payer: Self-pay

## 2019-11-24 DIAGNOSIS — I25118 Atherosclerotic heart disease of native coronary artery with other forms of angina pectoris: Secondary | ICD-10-CM

## 2019-11-24 DIAGNOSIS — E782 Mixed hyperlipidemia: Secondary | ICD-10-CM

## 2019-11-24 MED ORDER — REPATHA SURECLICK 140 MG/ML ~~LOC~~ SOAJ
140.0000 mL | SUBCUTANEOUS | 3 refills | Status: DC
Start: 2019-11-27 — End: 2019-11-30

## 2019-11-24 MED ORDER — EVOLOCUMAB 140 MG/ML ~~LOC~~ SOAJ
140.0000 mg | Freq: Once | SUBCUTANEOUS | Status: AC
  Administered 2019-11-24: 140 mg via SUBCUTANEOUS

## 2019-11-24 NOTE — Progress Notes (Signed)
Administration Action Time Recorded Time Documented By Site Comment Reason Patient Supplied  Given : 140 mg :  : Subcutaneous 11/24/19 0921 11/24/19 0923 ObenshineShelva Majestic, CMA Left Lower Abdomen ndc [phone]  No     ICD-10-CM   1. 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 Mixed hyperlipidemia  E78.2 Evolocumab SOAJ 140 mg     [name], MD, [facility] 11/24/2019, 12:36 PM Office: [phone] Pager: [phone]

## 2019-11-28 ENCOUNTER — Other Ambulatory Visit: Payer: Self-pay | Admitting: Cardiology

## 2019-11-29 NOTE — Telephone Encounter (Signed)
Pharmacy comment: Alternative Requested:THE PRESCRIBED MEDICATION IS NOT COVERED BY INSURANCE. PLEASE CONSIDER CHANGING TO ONE OF THE SUGGESTED COVERED ALTERNATIVES.

## 2019-11-29 NOTE — Telephone Encounter (Signed)
Ok with me. Please prescribe.

## 2019-11-30 ENCOUNTER — Other Ambulatory Visit: Payer: Self-pay | Admitting: Cardiology

## 2019-12-08 ENCOUNTER — Ambulatory Visit: Payer: BC Managed Care – PPO

## 2019-12-08 ENCOUNTER — Ambulatory Visit: Payer: BC Managed Care – PPO | Admitting: Cardiology

## 2019-12-08 ENCOUNTER — Other Ambulatory Visit: Payer: Self-pay

## 2019-12-08 DIAGNOSIS — E782 Mixed hyperlipidemia: Secondary | ICD-10-CM

## 2019-12-08 MED ORDER — PRALUENT 75 MG/ML ~~LOC~~ SOAJ
1.0000 mL | SUBCUTANEOUS | 6 refills | Status: DC
Start: 2019-12-08 — End: 2019-12-22

## 2019-12-08 MED ORDER — ALIROCUMAB 150 MG/ML ~~LOC~~ SOAJ
150.0000 mg | Freq: Once | SUBCUTANEOUS | Status: AC
  Administered 2019-12-08: 150 mg via SUBCUTANEOUS

## 2019-12-09 NOTE — Progress Notes (Signed)
Summary: 150 mg, Subcutaneous, Once, On Fri 12/08/19 at 1600, For 1 dose  Dose, Route, Frequency: 150 mg, Subcutaneous,Once Start: 12/08/2019 End: 12/08/2019

## 2019-12-13 ENCOUNTER — Other Ambulatory Visit: Payer: Self-pay | Admitting: Family Medicine

## 2019-12-13 DIAGNOSIS — Z1231 Encounter for screening mammogram for malignant neoplasm of breast: Secondary | ICD-10-CM

## 2019-12-22 ENCOUNTER — Ambulatory Visit: Payer: BC Managed Care – PPO | Admitting: Cardiology

## 2019-12-22 ENCOUNTER — Other Ambulatory Visit: Payer: Self-pay

## 2019-12-22 DIAGNOSIS — I25118 Atherosclerotic heart disease of native coronary artery with other forms of angina pectoris: Secondary | ICD-10-CM

## 2019-12-22 DIAGNOSIS — E782 Mixed hyperlipidemia: Secondary | ICD-10-CM

## 2019-12-22 MED ORDER — PRALUENT 75 MG/ML ~~LOC~~ SOAJ
1.0000 mL | SUBCUTANEOUS | 6 refills | Status: DC
Start: 2019-12-22 — End: 2020-06-18

## 2019-12-22 MED ORDER — ALIROCUMAB 75 MG/ML ~~LOC~~ SOAJ
75.0000 mg | Freq: Once | SUBCUTANEOUS | Status: AC
  Administered 2019-12-22: 75 mg via SUBCUTANEOUS

## 2019-12-22 NOTE — Progress Notes (Signed)
ICD-10-CM   1. Mixed hyperlipidemia  E78.2 Alirocumab SOAJ 75 mg  2. 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

## 2020-01-05 ENCOUNTER — Ambulatory Visit: Payer: BC Managed Care – PPO | Admitting: Student

## 2020-01-05 ENCOUNTER — Other Ambulatory Visit: Payer: Self-pay

## 2020-01-05 ENCOUNTER — Ambulatory Visit: Payer: BC Managed Care – PPO | Admitting: Cardiology

## 2020-01-05 ENCOUNTER — Encounter: Payer: Self-pay | Admitting: Student

## 2020-01-05 VITALS — BP 128/88 | HR 58 | Resp 16 | Ht 63.0 in | Wt 203.0 lb

## 2020-01-05 DIAGNOSIS — R0789 Other chest pain: Secondary | ICD-10-CM

## 2020-01-05 DIAGNOSIS — E782 Mixed hyperlipidemia: Secondary | ICD-10-CM

## 2020-01-05 DIAGNOSIS — I25118 Atherosclerotic heart disease of native coronary artery with other forms of angina pectoris: Secondary | ICD-10-CM

## 2020-01-05 DIAGNOSIS — R072 Precordial pain: Secondary | ICD-10-CM

## 2020-01-05 MED ORDER — ALIROCUMAB 75 MG/ML ~~LOC~~ SOAJ
75.0000 mg | Freq: Once | SUBCUTANEOUS | Status: AC
  Administered 2020-01-05: 10:00:00 75 mg via SUBCUTANEOUS

## 2020-01-05 MED ORDER — ISOSORBIDE MONONITRATE ER 60 MG PO TB24
60.0000 mg | ORAL_TABLET | Freq: Every day | ORAL | 3 refills | Status: DC
Start: 2020-01-05 — End: 2020-02-14

## 2020-01-05 NOTE — Progress Notes (Signed)
Patient is here for follow up visit.  Subjective:   _0  ID: Kristine Newman, female    DOB: 1975/06/28, 44 y.o.   MRN: [id number]  Chief Complaint  Patient presents with  . Chest Pain    44 year old African-American female with hypertension, hyperlipidemia, prediabetes, CAD  Patient presents for urgent visit with concerns of central chest pain intermittent for the last 1 week.  Pain is nonexertional and lasts less than 1 minute.  Patient reports pain is worse with stress at work.  Denies dyspnea, palpitations, dizziness, syncope, near syncope.  She continues to take Imdur 30 mg daily.  Her PCP has recently increased anxiety medication.  Current Outpatient Medications on File Prior to Visit  Medication Sig Dispense Refill  . acetaminophen (TYLENOL) 500 MG tablet Take 500 mg every 6 (six) hours as needed by mouth for mild pain.    . Alirocumab (PRALUENT) 75 MG/ML SOAJ Inject 1 mL into the skin every 14 (fourteen) days. 2 mL 6  . aspirin 81 MG chewable tablet Chew 1 tablet by mouth daily.    Marland Kitchen atorvastatin (LIPITOR) 80 MG tablet Take 1 tablet (80 mg total) by mouth daily at 6 PM. 90 tablet 3  . cetirizine (ZYRTEC) 10 MG tablet Take 1 tablet by mouth daily as needed.    . Chlorpheniramine-Acetaminophen (CORICIDIN HBP COLD/FLU PO) Take by mouth as needed.    . Cholecalciferol (VITAMIN D3) 50 MCG (2000 UT) capsule Take 1 capsule by mouth daily.    . clopidogrel (PLAVIX) 75 MG tablet TAKE 1 TABLET BY MOUTH EVERY DAY 90 tablet 3  . fluticasone (FLONASE) 50 MCG/ACT nasal spray Place 2 sprays daily as needed into both nostrils for allergies.     . isosorbide mononitrate (IMDUR) 30 MG 24 hr tablet Take 1 tablet (30 mg total) by mouth daily. 30 tablet 3  . lisinopril (ZESTRIL) 10 MG tablet TAKE 1 TABLET BY MOUTH EVERY DAY 90 tablet 3  . metoprolol succinate (TOPROL-XL) 50 MG 24 hr tablet TAKE 1 TABLET (50 MG TOTAL) BY MOUTH DAILY. TAKE WITH OR IMMEDIATELY FOLLOWING A MEAL. 90 tablet 3  .  nitroGLYCERIN (NITROSTAT) 0.4 MG SL tablet Place 1 tablet (0.4 mg total) under the tongue every 5 (five) minutes as needed for chest pain. 30 tablet 3  . pantoprazole (PROTONIX) 40 MG tablet Take 40 mg by mouth daily.    . sertraline (ZOLOFT) 50 MG tablet Take 50 mg by mouth daily.     No current facility-administered medications on file prior to visit.    Cardiovascular studies:   EKG 01/05/2020:  Sinus bradycardia at a rate of 57 bpm.  Normal axis.   Poor R wave progression, cannot exclude anteroseptal infarct old.  Nonspecific T wave abnormality.  Lexiscan Sestamibi Stress Test 10/02/2019: Patient attempted treadmill exercise stress test, however the stress test was converted to [place] at 7 minutes due to chest pain and inability to achieve target heart rate.  Patient exercised for 7: 07 minutes and achieved 8.76 METS.  Normal blood pressure response.  Resting EKG normal sinus rhythm with nonspecific T flattening.  Stress EKG reveals inferior and lateral T wave inversion.  Stress EKG overall is non-diagnostic. Mild degree medium extent perfusion defect consistent with mild (reversible) ischemia located in the mid inferoseptal wall and basal inferoseptal wall (Right Coronary Artery region) of left ventricle. Overall LV systolic function is normal without regional wall motion abnormalities. Stress LV EF: 56%.  No previous exam available for comparison. Intermediate risk  study. The defect size is small and ischemia is very mild.    EKG 09/15/2019: Sinus rhythm 63 bpm Possible old anteroseptal infarct Poor R wave progression  Hospital echocardiogram 11/16/2016: - Left ventricle: The cavity size was normal. Systolic function was   normal. The estimated ejection fraction was in the range of 55%   to 60%. Mild hypokinesis of the basalinferolateral myocardium.   Left ventricular diastolic function parameters were normal. - No significant valvular abnormality  Coronary  angiography/intervention 11/16/2016 and 11/18/2016: Severe LAD diagonal bifurcation 90% stenosis.   Moderate to severe proximal to mid RCA stenosis Resolute Onyx 3.0 X 30 mm mid LAD Synergy 2.5 X 20 mm Diag Synergy 3.0 X 32 mm prox-mid RCA  Labs:  10/31/2019: Glucose 102. BUN/Cr 11/0.9., eGFR 82 Chol 162, TG 187, HDL 42, LDL 88 HbA1C 6.6%  09/26/2019: Lipoprotein (a): 80.8  02/23/2019: Glucose 100, BUN/Cr 11/0.86. EGFR 94. Na/K 138/4.5. Rest of the CMP normal Chol 141, TG 151, HDL 38, LDL 77  Lipid panel 08/26/2017: Cholesterol 144, triglycerides 95, HDL 44, LDL 81  Labs 02/23/2017: Cholesterol 122, triglycerides 215, HDL 35, LDL 44  Lipid profile 11/16/2016: Cholesterol 275, LDL 190, HDL 45, triglyceride 199.   Review of Systems  Cardiovascular: Positive for chest pain (As per HPI). Negative for dyspnea on exertion, leg swelling, palpitations and syncope.  Respiratory: Negative for shortness of breath.   Neurological: Negative for dizziness.       Objective:    Vitals:   01/05/20 0913  BP: (!) 150/90  Pulse: (!) 58  Resp: 16  SpO2: 97%     Physical Exam Vitals and nursing note reviewed.  Constitutional:      Appearance: She is well-developed.  HENT:     Head: Normocephalic and atraumatic.  Neck:     Vascular: No JVD.  Cardiovascular:     Rate and Rhythm: Normal rate and regular rhythm.     Pulses: Intact distal pulses.     Heart sounds: Normal heart sounds, S1 normal and S2 normal. No murmur heard. No gallop.   Pulmonary:     Effort: Pulmonary effort is normal. No respiratory distress.     Breath sounds: Normal breath sounds. No wheezing, rhonchi or rales.  Musculoskeletal:     Right lower leg: No edema.     Left lower leg: No edema.  Neurological:     Mental Status: She is alert.         Assessment & Recommendations:   44 year old African-American female with hypertension, hyperlipidemia, prediabetes, CAD  CAD: S/p multivessel PCI for  NSTEMI 08/2016. Stress test in 09/2019 showed mild inferior ischemia.  No high risk findings on stress test. She has had recurrence of atypical chest pain symptoms.  Increased Imdur from 30 mg to 60 mg daily.  Continue metoprolol, lisinopril, lipitor, Praluent.  In view of recurrent chest pain symptoms recommend repeat coronary angiogram at this time.  Obtain BMP, CBC, and Covid-19 screening test.  Given complex coronary artery disease history and risk factors, recommend continuation of Plavix for long-term therapy.   Hypertension: Initially elevated in the office, well controlled upon recheck.   Hyperlipidemia: Continue lipitor and Praluent.   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   Patient was seen in collaboration with Dr. Virgina Jock. He also reviewed patient's chart and examined the patient. Dr. Virgina Jock is in agreement of the plan.    Alethia Berthold, PA-C 01/05/2020,  2:04 PM Office: [phone]

## 2020-01-05 NOTE — Progress Notes (Signed)
Administration Action Time Recorded Time Documented By Site Comment Reason Patient Supplied  Given : 75 mg :  : Subcutaneous 01/05/20 1024 01/05/20 1026 [address], [place] Right Lower Abdomen NDC [phone]  No      ICD-10-CM   1. Mixed hyperlipidemia  E78.2   2. 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

## 2020-01-24 ENCOUNTER — Other Ambulatory Visit: Payer: Self-pay | Admitting: Student

## 2020-01-25 LAB — CBC
Hematocrit: 41.7 % (ref 34.0–46.6)
Hemoglobin: 14.5 g/dL (ref 11.1–15.9)
MCH: 30.9 pg (ref 26.6–33.0)
MCHC: 34.8 g/dL (ref 31.5–35.7)
MCV: 89 fL (ref 79–97)
Platelets: 271 10*3/uL (ref 150–450)
RBC: 4.69 x10E6/uL (ref 3.77–5.28)
RDW: 12.7 % (ref 11.7–15.4)
WBC: 5.8 10*3/uL (ref 3.4–10.8)

## 2020-01-25 LAB — BASIC METABOLIC PANEL
BUN/Creatinine Ratio: 14 (ref 9–23)
BUN: 13 mg/dL (ref 6–24)
CO2: 22 mmol/L (ref 20–29)
Calcium: 9.9 mg/dL (ref 8.7–10.2)
Chloride: 102 mmol/L (ref 96–106)
Creatinine, Ser: 0.9 mg/dL (ref 0.57–1.00)
GFR calc Af Amer: 89 mL/min/{1.73_m2} (ref >59)
GFR calc non Af Amer: 77 mL/min/{1.73_m2} (ref >59)
Glucose: 92 mg/dL (ref 65–99)
Potassium: 4.2 mmol/L (ref 3.5–5.2)
Sodium: 139 mmol/L (ref 134–144)

## 2020-01-26 NOTE — Progress Notes (Signed)
Labs done prior to scheduled cardiac catheterization. CBC and BMP within normal limits. Patient scheduled for cath 01/30/20.

## 2020-01-27 ENCOUNTER — Other Ambulatory Visit (HOSPITAL_COMMUNITY)
Admission: RE | Admit: 2020-01-27 | Discharge: 2020-01-27 | Disposition: A | Discharge status: Home / Self Care | Payer: BC Managed Care – PPO | Source: Ambulatory Visit | Attending: Cardiology | Admitting: Cardiology

## 2020-01-27 DIAGNOSIS — Z01812 Encounter for preprocedural laboratory examination: Secondary | ICD-10-CM | POA: Insufficient documentation

## 2020-01-27 DIAGNOSIS — R7303 Prediabetes: Secondary | ICD-10-CM | POA: Diagnosis not present

## 2020-01-27 DIAGNOSIS — I10 Essential (primary) hypertension: Secondary | ICD-10-CM | POA: Diagnosis not present

## 2020-01-27 DIAGNOSIS — E785 Hyperlipidemia, unspecified: Secondary | ICD-10-CM | POA: Diagnosis not present

## 2020-01-27 DIAGNOSIS — Z7982 Long term (current) use of aspirin: Secondary | ICD-10-CM | POA: Diagnosis not present

## 2020-01-27 DIAGNOSIS — Z20822 Contact with and (suspected) exposure to covid-19: Secondary | ICD-10-CM | POA: Diagnosis not present

## 2020-01-27 DIAGNOSIS — Z955 Presence of coronary angioplasty implant and graft: Secondary | ICD-10-CM | POA: Diagnosis not present

## 2020-01-27 DIAGNOSIS — Z79899 Other long term (current) drug therapy: Secondary | ICD-10-CM | POA: Diagnosis not present

## 2020-01-27 DIAGNOSIS — I25118 Atherosclerotic heart disease of native coronary artery with other forms of angina pectoris: Secondary | ICD-10-CM | POA: Diagnosis not present

## 2020-01-27 LAB — SARS CORONAVIRUS 2 (TAT 6-24 HRS): SARS Coronavirus 2: NEGATIVE

## 2020-01-29 ENCOUNTER — Ambulatory Visit
Admission: RE | Admit: 2020-01-29 | Discharge: 2020-01-29 | Disposition: A | Discharge status: Home / Self Care | Payer: BC Managed Care – PPO | Source: Ambulatory Visit | Attending: Family Medicine | Admitting: Family Medicine

## 2020-01-29 ENCOUNTER — Other Ambulatory Visit: Payer: Self-pay

## 2020-01-29 DIAGNOSIS — Z1231 Encounter for screening mammogram for malignant neoplasm of breast: Secondary | ICD-10-CM

## 2020-01-30 ENCOUNTER — Encounter (HOSPITAL_COMMUNITY)
Admission: RE | Disposition: A | Discharge status: Home / Self Care | Payer: Self-pay | Source: Home / Self Care | Attending: Cardiology

## 2020-01-30 ENCOUNTER — Ambulatory Visit (HOSPITAL_COMMUNITY)
Admission: RE | Admit: 2020-01-30 | Discharge: 2020-01-30 | Disposition: A | Discharge status: Home / Self Care | Payer: BC Managed Care – PPO | Attending: Cardiology | Admitting: Cardiology

## 2020-01-30 ENCOUNTER — Encounter (HOSPITAL_COMMUNITY): Payer: Self-pay | Admitting: Cardiology

## 2020-01-30 DIAGNOSIS — I10 Essential (primary) hypertension: Secondary | ICD-10-CM | POA: Insufficient documentation

## 2020-01-30 DIAGNOSIS — R7303 Prediabetes: Secondary | ICD-10-CM | POA: Insufficient documentation

## 2020-01-30 DIAGNOSIS — I25118 Atherosclerotic heart disease of native coronary artery with other forms of angina pectoris: Secondary | ICD-10-CM | POA: Diagnosis not present

## 2020-01-30 DIAGNOSIS — E785 Hyperlipidemia, unspecified: Secondary | ICD-10-CM | POA: Insufficient documentation

## 2020-01-30 DIAGNOSIS — I2510 Atherosclerotic heart disease of native coronary artery without angina pectoris: Secondary | ICD-10-CM | POA: Diagnosis present

## 2020-01-30 DIAGNOSIS — Z79899 Other long term (current) drug therapy: Secondary | ICD-10-CM | POA: Insufficient documentation

## 2020-01-30 DIAGNOSIS — Z955 Presence of coronary angioplasty implant and graft: Secondary | ICD-10-CM | POA: Insufficient documentation

## 2020-01-30 DIAGNOSIS — Z7982 Long term (current) use of aspirin: Secondary | ICD-10-CM | POA: Insufficient documentation

## 2020-01-30 DIAGNOSIS — Z20822 Contact with and (suspected) exposure to covid-19: Secondary | ICD-10-CM | POA: Insufficient documentation

## 2020-01-30 HISTORY — PX: LEFT HEART CATH AND CORONARY ANGIOGRAPHY: CATH118249

## 2020-01-30 SURGERY — LEFT HEART CATH AND CORONARY ANGIOGRAPHY
Anesthesia: LOCAL

## 2020-01-30 MED ORDER — CLOPIDOGREL BISULFATE 75 MG PO TABS: 75.0000 mg | ORAL_TABLET | Freq: Once | ORAL | Status: DC

## 2020-01-30 MED ORDER — ACETAMINOPHEN 325 MG PO TABS: 650.0000 mg | ORAL_TABLET | ORAL | Status: DC | PRN

## 2020-01-30 MED ORDER — LIDOCAINE HCL (PF) 1 % IJ SOLN
INTRAMUSCULAR | Status: DC | PRN
  Administered 2020-01-30: 5 mL

## 2020-01-30 MED ORDER — HEPARIN SODIUM (PORCINE) 5000 UNIT/ML IJ SOLN
INTRAMUSCULAR | Status: AC
  Administered 2020-01-30: 3000 [IU] via INTRAVENOUS
  Filled 2020-01-30: qty 1

## 2020-01-30 MED ORDER — FENTANYL CITRATE (PF) 100 MCG/2ML IJ SOLN
INTRAMUSCULAR | Status: AC
  Filled 2020-01-30: qty 2

## 2020-01-30 MED ORDER — HEPARIN (PORCINE) IN NACL 1000-0.9 UT/500ML-% IV SOLN
INTRAVENOUS | Status: DC | PRN
  Administered 2020-01-30 (×2): 500 mL

## 2020-01-30 MED ORDER — ASPIRIN 81 MG PO CHEW: 81.0000 mg | CHEWABLE_TABLET | ORAL | Status: DC

## 2020-01-30 MED ORDER — SODIUM CHLORIDE 0.9 % IV SOLN: 250.0000 mL | INTRAVENOUS | Status: DC | PRN

## 2020-01-30 MED ORDER — SODIUM CHLORIDE 0.9% FLUSH: 3.0000 mL | INTRAVENOUS | Status: DC | PRN

## 2020-01-30 MED ORDER — SODIUM CHLORIDE 0.9 % WEIGHT BASED INFUSION: 1.0000 mL/kg/h | INTRAVENOUS | Status: DC

## 2020-01-30 MED ORDER — VERAPAMIL HCL 2.5 MG/ML IV SOLN
INTRAVENOUS | Status: DC | PRN
  Administered 2020-01-30: 10 mL via INTRA_ARTERIAL

## 2020-01-30 MED ORDER — MIDAZOLAM HCL 2 MG/2ML IJ SOLN
INTRAMUSCULAR | Status: DC | PRN
  Administered 2020-01-30: 2 mg via INTRAVENOUS

## 2020-01-30 MED ORDER — MIDAZOLAM HCL 2 MG/2ML IJ SOLN
INTRAMUSCULAR | Status: AC
  Filled 2020-01-30: qty 2

## 2020-01-30 MED ORDER — LIDOCAINE HCL (PF) 1 % IJ SOLN
INTRAMUSCULAR | Status: AC
  Filled 2020-01-30: qty 30

## 2020-01-30 MED ORDER — SODIUM CHLORIDE 0.9% FLUSH: 3.0000 mL | Freq: Two times a day (BID) | INTRAVENOUS | Status: DC

## 2020-01-30 MED ORDER — HYDRALAZINE HCL 20 MG/ML IJ SOLN: 10.0000 mg | INTRAMUSCULAR | Status: DC | PRN

## 2020-01-30 MED ORDER — IOHEXOL 350 MG/ML SOLN
INTRAVENOUS | Status: DC | PRN
  Administered 2020-01-30: 80 mL

## 2020-01-30 MED ORDER — HEPARIN (PORCINE) IN NACL 1000-0.9 UT/500ML-% IV SOLN
INTRAVENOUS | Status: AC
  Filled 2020-01-30: qty 1000

## 2020-01-30 MED ORDER — HEPARIN SODIUM (PORCINE) 5000 UNIT/ML IJ SOLN: 3000.0000 [IU] | Freq: Once | INTRAMUSCULAR | Status: DC

## 2020-01-30 MED ORDER — HEPARIN SODIUM (PORCINE) 1000 UNIT/ML IJ SOLN
INTRAMUSCULAR | Status: AC
  Filled 2020-01-30: qty 1

## 2020-01-30 MED ORDER — FENTANYL CITRATE (PF) 100 MCG/2ML IJ SOLN
INTRAMUSCULAR | Status: DC | PRN
  Administered 2020-01-30: 50 ug via INTRAVENOUS

## 2020-01-30 MED ORDER — LABETALOL HCL 5 MG/ML IV SOLN: 10.0000 mg | INTRAVENOUS | Status: DC | PRN

## 2020-01-30 MED ORDER — VERAPAMIL HCL 2.5 MG/ML IV SOLN
INTRAVENOUS | Status: AC
  Filled 2020-01-30: qty 2

## 2020-01-30 MED ORDER — SODIUM CHLORIDE 0.9 % WEIGHT BASED INFUSION
3.0000 mL/kg/h | INTRAVENOUS | Status: AC
  Administered 2020-01-30: 3 mL/kg/h via INTRAVENOUS

## 2020-01-30 MED ORDER — HEPARIN SODIUM (PORCINE) 1000 UNIT/ML IJ SOLN
INTRAMUSCULAR | Status: DC | PRN
  Administered 2020-01-30: 2000 [IU] via INTRAVENOUS

## 2020-01-30 MED ORDER — SODIUM CHLORIDE 0.9 % IV SOLN: INTRAVENOUS | Status: DC

## 2020-01-30 MED ORDER — ONDANSETRON HCL 4 MG/2ML IJ SOLN: 4.0000 mg | Freq: Four times a day (QID) | INTRAMUSCULAR | Status: DC | PRN

## 2020-01-30 SURGICAL SUPPLY — 10 items
CATH 5FR JL3.5 JR4 ANG PIG MP (CATHETERS) ×2 IMPLANT
CATH OPTITORQUE TIG 4.0 5F (CATHETERS) ×2 IMPLANT
DEVICE RAD COMP TR BAND LRG (VASCULAR PRODUCTS) ×2 IMPLANT
GLIDESHEATH SLEND A-KIT 6F 22G (SHEATH) ×2 IMPLANT
GUIDEWIRE INQWIRE 1.5J.035X260 (WIRE) ×1 IMPLANT
INQWIRE 1.5J .035X260CM (WIRE) ×2
KIT HEART LEFT (KITS) ×2 IMPLANT
PACK CARDIAC CATHETERIZATION (CUSTOM PROCEDURE TRAY) ×2 IMPLANT
TRANSDUCER W/STOPCOCK (MISCELLANEOUS) ×2 IMPLANT
TUBING CIL FLEX 10 FLL-RA (TUBING) ×2 IMPLANT

## 2020-01-30 NOTE — H&P (Signed)
OV 01/05/2020 copied for documentation     Patient is here for follow up visit.  Subjective:   _0  ID: Kristine Newman, female    DOB: [birth year], 45 y.o.   MRN: [id number]  No chief complaint on file.   45 year old African-American female with hypertension, hyperlipidemia, prediabetes, CAD  Patient presents for urgent visit with concerns of central chest pain intermittent for the last 1 week.  Pain is nonexertional and lasts less than 1 minute.  Patient reports pain is worse with stress at work.  Denies dyspnea, palpitations, dizziness, syncope, near syncope.  She continues to take Imdur 30 mg daily.  Her PCP has recently increased anxiety medication.  No current facility-administered medications on file prior to encounter.   Current Outpatient Medications on File Prior to Encounter  Medication Sig Dispense Refill  . acetaminophen (TYLENOL) 500 MG tablet Take 500 mg every 6 (six) hours as needed by mouth for mild pain.    . Alirocumab (PRALUENT) 75 MG/ML SOAJ Inject 1 mL into the skin every 14 (fourteen) days. (Patient taking differently: Inject 75 mg into the skin every 14 (fourteen) days.) 2 mL 6  . aspirin 81 MG chewable tablet Chew 81 mg by mouth daily.    Marland Kitchen atorvastatin (LIPITOR) 80 MG tablet Take 1 tablet (80 mg total) by mouth daily at 6 PM. 90 tablet 3  . cetirizine (ZYRTEC) 10 MG tablet Take 10 mg by mouth daily.    . Chlorpheniramine-APAP 2-325 MG TABS Take 1 tablet by mouth as needed (Cold or flu).    . Cholecalciferol (VITAMIN D3) 125 MCG (5000 UT) TABS Take 5,000 Units by mouth daily.    . clopidogrel (PLAVIX) 75 MG tablet TAKE 1 TABLET BY MOUTH EVERY DAY (Patient taking differently: Take 75 mg by mouth daily.) 90 tablet 3  . fluticasone (FLONASE) 50 MCG/ACT nasal spray Place 2 sprays daily as needed into both nostrils for allergies.     . isosorbide mononitrate (IMDUR) 60 MG 24 hr tablet Take 1 tablet (60 mg total) by mouth daily. 30 tablet 3  . lisinopril (ZESTRIL) 10  MG tablet TAKE 1 TABLET BY MOUTH EVERY DAY (Patient taking differently: Take 10 mg by mouth daily.) 90 tablet 3  . metoprolol succinate (TOPROL-XL) 50 MG 24 hr tablet TAKE 1 TABLET (50 MG TOTAL) BY MOUTH DAILY. TAKE WITH OR IMMEDIATELY FOLLOWING A MEAL. 90 tablet 3  . nitroGLYCERIN (NITROSTAT) 0.4 MG SL tablet Place 1 tablet (0.4 mg total) under the tongue every 5 (five) minutes as needed for chest pain. 30 tablet 3  . pantoprazole (PROTONIX) 40 MG tablet Take 40 mg by mouth daily.    . sertraline (ZOLOFT) 50 MG tablet Take 50 mg by mouth at bedtime.      Cardiovascular studies:   EKG 01/05/2020:  Sinus bradycardia at a rate of 57 bpm.  Normal axis.   Poor R wave progression, cannot exclude anteroseptal infarct old.  Nonspecific T wave abnormality.  Lexiscan Sestamibi Stress Test 10/02/2019: Patient attempted treadmill exercise stress test, however the stress test was converted to [place] at 7 minutes due to chest pain and inability to achieve target heart rate.  Patient exercised for 7: 07 minutes and achieved 8.76 METS.  Normal blood pressure response.  Resting EKG normal sinus rhythm with nonspecific T flattening.  Stress EKG reveals inferior and lateral T wave inversion.  Stress EKG overall is non-diagnostic. Mild degree medium extent perfusion defect consistent with mild (reversible) ischemia located in the mid inferoseptal wall  and basal inferoseptal wall (Right Coronary Artery region) of left ventricle. Overall LV systolic function is normal without regional wall motion abnormalities. Stress LV EF: 56%.  No previous exam available for comparison. Intermediate risk study. The defect size is small and ischemia is very mild.    EKG 09/15/2019: Sinus rhythm 63 bpm Possible old anteroseptal infarct Poor R wave progression  Hospital echocardiogram 11/16/2016: - Left ventricle: The cavity size was normal. Systolic function was   normal. The estimated ejection fraction was in the range of  55%   to 60%. Mild hypokinesis of the basalinferolateral myocardium.   Left ventricular diastolic function parameters were normal. - No significant valvular abnormality  Coronary angiography/intervention 11/16/2016 and 11/18/2016: Severe LAD diagonal bifurcation 90% stenosis.   Moderate to severe proximal to mid RCA stenosis Resolute Onyx 3.0 X 30 mm mid LAD Synergy 2.5 X 20 mm Diag Synergy 3.0 X 32 mm prox-mid RCA  Labs:  10/31/2019: Glucose 102. BUN/Cr 11/0.9., eGFR 82 Chol 162, TG 187, HDL 42, LDL 88 HbA1C 6.6%  09/26/2019: Lipoprotein (a): 80.8  02/23/2019: Glucose 100, BUN/Cr 11/0.86. EGFR 94. Na/K 138/4.5. Rest of the CMP normal Chol 141, TG 151, HDL 38, LDL 77  Lipid panel 08/26/2017: Cholesterol 144, triglycerides 95, HDL 44, LDL 81  Labs 02/23/2017: Cholesterol 122, triglycerides 215, HDL 35, LDL 44  Lipid profile 11/16/2016: Cholesterol 275, LDL 190, HDL 45, triglyceride 199.   Review of Systems  Cardiovascular: Positive for chest pain (As per HPI). Negative for dyspnea on exertion, leg swelling, palpitations and syncope.  Respiratory: Negative for shortness of breath.   Neurological: Negative for dizziness.       Objective:    There were no vitals filed for this visit.   Physical Exam Vitals and nursing note reviewed.  Constitutional:      Appearance: She is well-developed.  HENT:     Head: Normocephalic and atraumatic.  Neck:     Vascular: No JVD.  Cardiovascular:     Rate and Rhythm: Normal rate and regular rhythm.     Pulses: Intact distal pulses.     Heart sounds: Normal heart sounds, S1 normal and S2 normal. No murmur heard. No gallop.   Pulmonary:     Effort: Pulmonary effort is normal. No respiratory distress.     Breath sounds: Normal breath sounds. No wheezing, rhonchi or rales.  Musculoskeletal:     Right lower leg: No edema.     Left lower leg: No edema.  Neurological:     Mental Status: She is alert.         Assessment &  Recommendations:   45 year old African-American female with hypertension, hyperlipidemia, prediabetes, CAD  CAD: S/p multivessel PCI for NSTEMI 08/2016. Stress test in 09/2019 showed mild inferior ischemia.  No high risk findings on stress test. She has had recurrence of atypical chest pain symptoms.  Increased Imdur from 30 mg to 60 mg daily.  Continue metoprolol, lisinopril, lipitor, Praluent.  In view of recurrent chest pain symptoms recommend repeat coronary angiogram at this time.  Obtain BMP, CBC, and Covid-19 screening test.  Given complex coronary artery disease history and risk factors, recommend continuation of Plavix for long-term therapy.   Hypertension: Initially elevated in the office, well controlled upon recheck.   Hyperlipidemia: Continue lipitor and Praluent.   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   Patient was seen in collaboration with Dr. Virgina Jock. He also reviewed  patient's chart and examined the patient. Dr. Virgina Jock is in agreement of the plan.    [place], PA-C 01/30/2020, 9:08 AM Office: [phone]

## 2020-01-30 NOTE — Progress Notes (Signed)
Dr. Joaquim Nam called and ordered patient to have 3000 units IV heparin.- which was given in [facility].

## 2020-01-30 NOTE — Discharge Instructions (Signed)
DRINK PLENTY OF FLUIDS OVER THE NEXT 2-3 DAYS.  Radial Site Care  This sheet gives you information about how to care for yourself after your procedure. Your health care provider may also give you more specific instructions. If you have problems or questions, contact your health care provider. What can I expect after the procedure? After the procedure, it is common to have:  Bruising and tenderness at the catheter insertion area. Follow these instructions at home: Medicines  Take over-the-counter and prescription medicines only as told by your health care provider. Insertion site care  Follow instructions from your health care provider about how to take care of your insertion site. Make sure you: ? Wash your hands with soap and water before you change your bandage (dressing). If soap and water are not available, use hand sanitizer. ? Change your dressing as told by your health care provider. ? Leave stitches (sutures), skin glue, or adhesive strips in place. These skin closures may need to stay in place for 2 weeks or longer. If adhesive strip edges start to loosen and curl up, you may trim the loose edges. Do not remove adhesive strips completely unless your health care provider tells you to do that.  Check your insertion site every day for signs of infection. Check for: ? Redness, swelling, or pain. ? Fluid or blood. ? Pus or a bad smell. ? Warmth.  Do not take baths, swim, or use a hot tub until your health care provider approves.  You may shower 24-48 hours after the procedure, or as directed by your health care provider. ? Remove the dressing and gently wash the site with plain soap and water. ? Pat the area dry with a clean towel. ? Do not rub the site. That could cause bleeding.  Do not apply powder or lotion to the site. Activity  For 24 hours after the procedure, or as directed by your health care provider: ? Do not flex or bend the affected arm. ? Do not push or pull  heavy objects with the affected arm. ? Do not drive yourself home from the hospital or clinic. You may drive 24 hours after the procedure unless your health care provider tells you not to. ? Do not operate machinery or power tools.  Do not lift anything that is heavier than 10 lb (4.5 kg), or the limit that you are told, until your health care provider says that it is safe.  Ask your health care provider when it is okay to: ? Return to work or school. ? Resume usual physical activities or sports. ? Resume sexual activity.   General instructions  If the catheter site starts to bleed, raise your arm and put firm pressure on the site. If the bleeding does not stop, get help right away. This is a medical emergency.  If you went home on the same day as your procedure, a responsible adult should be with you for the first 24 hours after you arrive home.  Keep all follow-up visits as told by your health care provider. This is important. Contact a health care provider if:  You have a fever.  You have redness, swelling, or yellow drainage around your insertion site. Get help right away if:  You have unusual pain at the radial site.  The catheter insertion area swells very fast.  The insertion area is bleeding, and the bleeding does not stop when you hold steady pressure on the area.  Your arm or hand becomes pale,   cool, tingly, or numb. These symptoms may represent a serious problem that is an emergency. Do not wait to see if the symptoms will go away. Get medical help right away. Call your local emergency services (911 in the [place] Do not drive yourself to the hospital. Summary  After the procedure, it is common to have bruising and tenderness at the site.  Follow instructions from your health care provider about how to take care of your radial site wound. Check the wound every day for signs of infection.  Do not lift anything that is heavier than 10 lb (4.5 kg), or the limit that you  are told, until your health care provider says that it is safe. This information is not intended to replace advice given to you by your health care provider. Make sure you discuss any questions you have with your health care provider. Document Revised: 02/10/2017 Document Reviewed: 02/10/2017 Elsevier Patient Education  2021 [employer]  

## 2020-02-09 ENCOUNTER — Other Ambulatory Visit: Payer: Self-pay | Admitting: Cardiology

## 2020-02-09 ENCOUNTER — Ambulatory Visit: Payer: BC Managed Care – PPO | Admitting: Cardiology

## 2020-02-14 ENCOUNTER — Encounter: Payer: Self-pay | Admitting: Cardiology

## 2020-02-14 ENCOUNTER — Other Ambulatory Visit: Payer: Self-pay

## 2020-02-14 ENCOUNTER — Ambulatory Visit: Payer: BC Managed Care – PPO | Admitting: Cardiology

## 2020-02-14 VITALS — BP 133/80 | HR 56 | Temp 97.3°F | Resp 16 | Ht 63.0 in | Wt 204.0 lb

## 2020-02-14 DIAGNOSIS — E1159 Type 2 diabetes mellitus with other circulatory complications: Secondary | ICD-10-CM

## 2020-02-14 DIAGNOSIS — E782 Mixed hyperlipidemia: Secondary | ICD-10-CM

## 2020-02-14 DIAGNOSIS — I10 Essential (primary) hypertension: Secondary | ICD-10-CM

## 2020-02-14 DIAGNOSIS — I2510 Atherosclerotic heart disease of native coronary artery without angina pectoris: Secondary | ICD-10-CM

## 2020-02-14 NOTE — Progress Notes (Signed)
Patient is here for follow up visit.  Subjective:   '@Patient'  ID: Kristine Newman, female    DOB: 03-17-1975, 45 y.o.   MRN: [id number]  Chief Complaint  Patient presents with  . Coronary artery disease involving native coronary artery of  . Follow-up    45 year old African-American female with hypertension, hyperlipidemia, prediabetes, CAD w/ NSTEMI, PCI to LAD/Diag, RCA in 2018  Coronary angiogram in 12/2019 showed patent stents, no obstructive CAD. She has not had any recurrence of chest pain. She is increasing her physical activity. She reports headaches with imdur.   Current Outpatient Medications on File Prior to Visit  Medication Sig Dispense Refill  . acetaminophen (TYLENOL) 500 MG tablet Take 500 mg every 6 (six) hours as needed by mouth for mild pain.    . Alirocumab (PRALUENT) 75 MG/ML SOAJ Inject 1 mL into the skin every 14 (fourteen) days. (Patient taking differently: Inject 75 mg into the skin every 14 (fourteen) days.) 2 mL 6  . aspirin 81 MG chewable tablet Chew 81 mg by mouth daily.    Marland Kitchen atorvastatin (LIPITOR) 80 MG tablet TAKE 1 TABLET (80 MG TOTAL) BY MOUTH DAILY AT 6 PM. 90 tablet 3  . cetirizine (ZYRTEC) 10 MG tablet Take 10 mg by mouth daily.    . Chlorpheniramine-APAP 2-325 MG TABS Take 1 tablet by mouth as needed (Cold or flu).    . Cholecalciferol (VITAMIN D3) 125 MCG (5000 UT) TABS Take 5,000 Units by mouth daily.    . clopidogrel (PLAVIX) 75 MG tablet TAKE 1 TABLET BY MOUTH EVERY DAY (Patient taking differently: Take 75 mg by mouth daily.) 90 tablet 3  . fluticasone (FLONASE) 50 MCG/ACT nasal spray Place 2 sprays daily as needed into both nostrils for allergies.     . isosorbide mononitrate (IMDUR) 60 MG 24 hr tablet Take 1 tablet (60 mg total) by mouth daily. 30 tablet 3  . lisinopril (ZESTRIL) 10 MG tablet TAKE 1 TABLET BY MOUTH EVERY DAY (Patient taking differently: Take 10 mg by mouth daily.) 90 tablet 3  . metoprolol succinate (TOPROL-XL) 50 MG 24 hr tablet  TAKE 1 TABLET (50 MG TOTAL) BY MOUTH DAILY. TAKE WITH OR IMMEDIATELY FOLLOWING A MEAL. 90 tablet 3  . nitroGLYCERIN (NITROSTAT) 0.4 MG SL tablet Place 1 tablet (0.4 mg total) under the tongue every 5 (five) minutes as needed for chest pain. 30 tablet 3  . pantoprazole (PROTONIX) 40 MG tablet Take 40 mg by mouth daily.    . sertraline (ZOLOFT) 50 MG tablet Take 50 mg by mouth at bedtime.     No current facility-administered medications on file prior to visit.    Cardiovascular studies:   Coronary angiography 01/18/2020: LM: Normal LAD: Prox-mid LAD/Diag bifurcation stent patent with no restenosis LCx: Normal RCA: Patent prox RCA stent with no restenosis  LVEDP normal    EKG 01/05/2020:  Sinus bradycardia at a rate of 57 bpm.  Normal axis.   Poor R wave progression, cannot exclude anteroseptal infarct old.  Nonspecific T wave abnormality.  Lexiscan Sestamibi Stress Test 10/02/2019: Patient attempted treadmill exercise stress test, however the stress test was converted to [place] at 7 minutes due to chest pain and inability to achieve target heart rate.  Patient exercised for 7: 07 minutes and achieved 8.76 METS.  Normal blood pressure response.  Resting EKG normal sinus rhythm with nonspecific T flattening.  Stress EKG reveals inferior and lateral T wave inversion.  Stress EKG overall is non-diagnostic. Mild degree  medium extent perfusion defect consistent with mild (reversible) ischemia located in the mid inferoseptal wall and basal inferoseptal wall (Right Coronary Artery region) of left ventricle. Overall LV systolic function is normal without regional wall motion abnormalities. Stress LV EF: 56%.  No previous exam available for comparison. Intermediate risk study. The defect size is small and ischemia is very mild.    EKG 09/15/2019: Sinus rhythm 63 bpm Possible old anteroseptal infarct Poor R wave progression  Hospital echocardiogram 11/16/2016: - Left ventricle: The cavity  size was normal. Systolic function was   normal. The estimated ejection fraction was in the range of 55%   to 60%. Mild hypokinesis of the basalinferolateral myocardium.   Left ventricular diastolic function parameters were normal. - No significant valvular abnormality  Coronary angiography/intervention 11/16/2016 and 11/18/2016: Severe LAD diagonal bifurcation 90% stenosis.   Moderate to severe proximal to mid RCA stenosis Resolute Onyx 3.0 X 30 mm mid LAD Synergy 2.5 X 20 mm Diag Synergy 3.0 X 32 mm prox-mid RCA  Labs:  10/31/2019: Glucose 102. BUN/Cr 11/0.9., eGFR 82 Chol 162, TG 187, HDL 42, LDL 88 HbA1C 6.6%  09/26/2019: Lipoprotein (a): 80.8  02/23/2019: Glucose 100, BUN/Cr 11/0.86. EGFR 94. Na/K 138/4.5. Rest of the CMP normal Chol 141, TG 151, HDL 38, LDL 77  Lipid panel 08/26/2017: Cholesterol 144, triglycerides 95, HDL 44, LDL 81  Labs 02/23/2017: Cholesterol 122, triglycerides 215, HDL 35, LDL 44  Lipid profile 11/16/2016: Cholesterol 275, LDL 190, HDL 45, triglyceride 199.   Review of Systems  Cardiovascular: Positive for chest pain (As per HPI). Negative for dyspnea on exertion, leg swelling, palpitations and syncope.  Respiratory: Negative for shortness of breath.   Neurological: Negative for dizziness.       Objective:    Vitals:   02/14/20 0936  BP: 133/80  Pulse: (!) 56  Resp: 16  Temp: (!) 97.3 F (36.3 C)  SpO2: 99%     Physical Exam Vitals and nursing note reviewed.  Constitutional:      Appearance: She is well-developed.  HENT:     Head: Normocephalic and atraumatic.  Neck:     Vascular: No JVD.  Cardiovascular:     Rate and Rhythm: Normal rate and regular rhythm.     Pulses: Intact distal pulses.     Heart sounds: Normal heart sounds, S1 normal and S2 normal. No murmur heard. No gallop.   Pulmonary:     Effort: Pulmonary effort is normal. No respiratory distress.     Breath sounds: Normal breath sounds. No wheezing, rhonchi or  rales.  Musculoskeletal:     Right lower leg: No edema.     Left lower leg: No edema.  Neurological:     Mental Status: She is alert.         Assessment & Recommendations:   45 year old African-American female with hypertension, hyperlipidemia, prediabetes, CAD w/ NSTEMI, PCI to LAD/Diag, RCA in 2018  CAD without angina: S/p multivessel PCI for NSTEMI 08/2016. Patent stents and no obstructive CAD cath 01/18/2020 Stopped Imdur and Plavix. Continue Aspirin, statin, Praluent, metoprolol.  Hypertension: Controlled  Hyperlipidemia: Continue lipitor and Praluent.  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

## 2020-02-29 ENCOUNTER — Ambulatory Visit: Payer: BC Managed Care – PPO | Admitting: Cardiology

## 2020-03-13 ENCOUNTER — Ambulatory Visit: Payer: BC Managed Care – PPO | Admitting: Cardiology

## 2020-03-13 ENCOUNTER — Other Ambulatory Visit: Payer: Self-pay

## 2020-03-13 DIAGNOSIS — Z006 Encounter for examination for normal comparison and control in clinical research program: Secondary | ICD-10-CM | POA: Insufficient documentation

## 2020-03-13 NOTE — Progress Notes (Signed)
HORIZON: A Phase II Study to Evaluate the Safety and Efficacy of Two Doses of GT005  

## 2020-06-10 ENCOUNTER — Other Ambulatory Visit: Payer: Self-pay | Admitting: Cardiology

## 2020-06-15 ENCOUNTER — Other Ambulatory Visit: Payer: Self-pay | Admitting: Cardiology

## 2020-06-17 ENCOUNTER — Other Ambulatory Visit: Payer: Self-pay | Admitting: Cardiology

## 2020-06-17 ENCOUNTER — Other Ambulatory Visit: Payer: Self-pay | Admitting: Student

## 2020-06-17 DIAGNOSIS — I25118 Atherosclerotic heart disease of native coronary artery with other forms of angina pectoris: Secondary | ICD-10-CM

## 2020-08-12 ENCOUNTER — Other Ambulatory Visit: Payer: Self-pay | Admitting: Cardiology

## 2020-08-12 DIAGNOSIS — I2510 Atherosclerotic heart disease of native coronary artery without angina pectoris: Secondary | ICD-10-CM

## 2020-10-30 ENCOUNTER — Other Ambulatory Visit: Payer: Self-pay

## 2020-10-30 MED ORDER — NITROGLYCERIN 0.4 MG SL SUBL: 0.4000 mg | SUBLINGUAL_TABLET | SUBLINGUAL | 3 refills | Status: DC | PRN

## 2020-11-13 ENCOUNTER — Other Ambulatory Visit: Payer: Self-pay

## 2020-11-13 MED ORDER — PRALUENT 75 MG/ML ~~LOC~~ SOAJ: 1.0000 mL | SUBCUTANEOUS | 2 refills | Status: DC

## 2021-01-27 ENCOUNTER — Other Ambulatory Visit: Payer: Self-pay | Admitting: Family Medicine

## 2021-01-27 DIAGNOSIS — Z1231 Encounter for screening mammogram for malignant neoplasm of breast: Secondary | ICD-10-CM

## 2021-01-31 ENCOUNTER — Other Ambulatory Visit: Payer: Self-pay

## 2021-01-31 ENCOUNTER — Ambulatory Visit
Admission: RE | Admit: 2021-01-31 | Discharge: 2021-01-31 | Disposition: A | Discharge status: Home / Self Care | Payer: BC Managed Care – PPO | Source: Ambulatory Visit | Attending: Family Medicine | Admitting: Family Medicine

## 2021-01-31 DIAGNOSIS — Z1231 Encounter for screening mammogram for malignant neoplasm of breast: Secondary | ICD-10-CM

## 2021-02-12 ENCOUNTER — Other Ambulatory Visit: Payer: Self-pay | Admitting: Student

## 2021-02-12 NOTE — Progress Notes (Signed)
Patient is here for follow up visit.  Subjective:   '@Patient'  ID: Kristine Newman, female    DOB: 1975/07/01, 46 y.o.   MRN: [id number]  Chief Complaint  Patient presents with   Coronary Artery Disease   Follow-up    46 year    46 year old African-American female with hypertension, hyperlipidemia, prediabetes, CAD w/ NSTEMI, PCI to LAD/Diag, RCA in 2018  Patient is doing well.  She continues to walk 30 minutes regularly without much difficulty.  She had occasional episodes of chest pressure while at rest that she had to take nitroglycerin for.  Episodes occur no more than 2-3 times in a year.  Blood pressures well controlled.  She is compliant with medical therapy.  Current Outpatient Medications on File Prior to Visit  Medication Sig Dispense Refill   acetaminophen (TYLENOL) 500 MG tablet Take 500 mg every 6 (six) hours as needed by mouth for mild pain.     Alirocumab (PRALUENT) 75 MG/ML SOAJ Inject 1 mL into the skin every 14 (fourteen) days. 2 mL 2   aspirin 81 MG chewable tablet Chew 81 mg by mouth daily.     atorvastatin (LIPITOR) 80 MG tablet TAKE 1 TABLET BY MOUTH DAILY AT 6 PM. 90 tablet 3   cetirizine (ZYRTEC) 10 MG tablet Take 10 mg by mouth daily.     Chlorpheniramine-APAP 2-325 MG TABS Take 1 tablet by mouth as needed (Cold or flu).     Cholecalciferol (VITAMIN D3) 125 MCG (5000 UT) TABS Take 5,000 Units by mouth daily.     fluticasone (FLONASE) 50 MCG/ACT nasal spray Place 2 sprays daily as needed into both nostrils for allergies.      hydrOXYzine (ATARAX) 10 MG tablet Take 10-20 mg by mouth every 8 (eight) hours as needed.     lisinopril (ZESTRIL) 10 MG tablet Take 1 tablet (10 mg total) by mouth daily. 90 tablet 3   metoprolol succinate (TOPROL-XL) 50 MG 24 hr tablet TAKE 1 TABLET (50 MG TOTAL) BY MOUTH DAILY. TAKE WITH OR IMMEDIATELY FOLLOWING A MEAL. 90 tablet 3   nitroGLYCERIN (NITROSTAT) 0.4 MG SL tablet Place 1 tablet (0.4 mg total) under the tongue every 5 (five)  minutes as needed for chest pain. 30 tablet 3   pantoprazole (PROTONIX) 40 MG tablet Take 40 mg by mouth daily.     sertraline (ZOLOFT) 50 MG tablet Take 50 mg by mouth at bedtime.     No current facility-administered medications on file prior to visit.    Cardiovascular studies:   EKG 02/13/2021: Sinus rhythm 61 bpm Low voltage in precordial leads Old anteroseptal infarct  Coronary angiography 01/18/2020: LM: Normal LAD: Prox-mid LAD/Diag bifurcation stent patent with no restenosis LCx: Normal RCA: Patent prox RCA stent with no restenosis   LVEDP normal    Lexiscan Sestamibi Stress Test 10/02/2019: Patient attempted treadmill exercise stress test, however the stress test was converted to [place] at 7 minutes due to chest pain and inability to achieve target heart rate.  Patient exercised for 7: 07 minutes and achieved 8.76 METS.  Normal blood pressure response.  Resting EKG normal sinus rhythm with nonspecific T flattening.  Stress EKG reveals inferior and lateral T wave inversion.  Stress EKG overall is non-diagnostic. Mild degree medium extent perfusion defect consistent with mild (reversible) ischemia located in the mid inferoseptal wall and basal inferoseptal wall (Right Coronary Artery region) of left ventricle. Overall LV systolic function is normal without regional wall motion abnormalities. Stress LV EF:  56%.  No previous exam available for comparison. Intermediate risk study. The defect size is small and ischemia is very mild.    EKG 09/15/2019: Sinus rhythm 63 bpm Possible old anteroseptal infarct Poor R wave progression  Hospital echocardiogram 11/16/2016: - Left ventricle: The cavity size was normal. Systolic function was   normal. The estimated ejection fraction was in the range of 55%   to 60%. Mild hypokinesis of the basalinferolateral myocardium.   Left ventricular diastolic function parameters were normal. - No significant valvular abnormality  Coronary  angiography/intervention 11/16/2016 and 11/18/2016: Severe LAD diagonal bifurcation 90% stenosis.   Moderate to severe proximal to mid RCA stenosis Resolute Onyx 3.0 X 30 mm mid LAD Synergy 2.5 X 20 mm Diag Synergy 3.0 X 32 mm prox-mid RCA  Labs:  10/31/2019: Glucose 102. BUN/Cr 11/0.9., eGFR 82 Chol 162, TG 187, HDL 42, LDL 88 HbA1C 6.6%  09/26/2019: Lipoprotein (a): 80.8  02/23/2019: Glucose 100, BUN/Cr 11/0.86. EGFR 94. Na/K 138/4.5. Rest of the CMP normal Chol 141, TG 151, HDL 38, LDL 77  Lipid panel 08/26/2017: Cholesterol 144, triglycerides 95, HDL 44, LDL 81  Labs 02/23/2017: Cholesterol 122, triglycerides 215, HDL 35, LDL 44  Lipid profile 11/16/2016: Cholesterol 275, LDL 190, HDL 45, triglyceride 199.   Review of Systems  Cardiovascular:  Positive for chest pain (Occasional). Negative for dyspnea on exertion, leg swelling, palpitations and syncope.  Respiratory:  Negative for shortness of breath.   Neurological:  Negative for dizziness.      Objective:    Vitals:   02/13/21 0934  BP: 129/87  Pulse: 68  Resp: 16  Temp: 98 F (36.7 C)  SpO2: 98%     Physical Exam Vitals and nursing note reviewed.  Constitutional:      Appearance: She is well-developed.  HENT:     Head: Normocephalic and atraumatic.  Neck:     Vascular: No JVD.  Cardiovascular:     Rate and Rhythm: Normal rate and regular rhythm.     Pulses: Intact distal pulses.     Heart sounds: Normal heart sounds, S1 normal and S2 normal. No murmur heard.   No gallop.  Pulmonary:     Effort: Pulmonary effort is normal. No respiratory distress.     Breath sounds: Normal breath sounds. No wheezing, rhonchi or rales.  Musculoskeletal:     Right lower leg: No edema.     Left lower leg: No edema.  Neurological:     Mental Status: She is alert.        Assessment & Recommendations:   46 year old African-American female with hypertension, hyperlipidemia, prediabetes, CAD w/ NSTEMI, PCI to  LAD/Diag, RCA in 2018  CAD: S/p multivessel PCI for NSTEMI 08/2016. Patent stents and no obstructive CAD cath 01/18/2020 Occasional use of nitroglycerin, but chest pain likely atypical.  Continue monitoring.  If she has increased frequency of nitroglycerin, she knows to contact me sooner.  Otherwise, continue current medical therapy.  Continue Aspirin, statin, Praluent, metoprolol.  Hypertension: Controlled  Hyperlipidemia: Continue lipitor and Praluent.  Check lipid panel   F/u in 6 months or sooner, if increasing of nitroglycerin   [employer], PA-C 02/12/2021, 4:18 PM Office: [phone]

## 2021-02-13 ENCOUNTER — Ambulatory Visit: Payer: BC Managed Care – PPO | Admitting: Cardiology

## 2021-02-13 ENCOUNTER — Encounter: Payer: Self-pay | Admitting: Cardiology

## 2021-02-13 ENCOUNTER — Other Ambulatory Visit: Payer: Self-pay

## 2021-02-13 VITALS — BP 129/87 | HR 68 | Temp 98.0°F | Resp 16 | Ht 63.0 in | Wt 217.0 lb

## 2021-02-13 DIAGNOSIS — I2510 Atherosclerotic heart disease of native coronary artery without angina pectoris: Secondary | ICD-10-CM

## 2021-02-13 DIAGNOSIS — E782 Mixed hyperlipidemia: Secondary | ICD-10-CM

## 2021-04-11 ENCOUNTER — Other Ambulatory Visit: Payer: Self-pay | Admitting: Cardiology

## 2021-05-11 ENCOUNTER — Other Ambulatory Visit: Payer: Self-pay | Admitting: Cardiology

## 2021-05-15 ENCOUNTER — Other Ambulatory Visit: Payer: Self-pay | Admitting: Cardiology

## 2021-08-10 ENCOUNTER — Other Ambulatory Visit: Payer: Self-pay | Admitting: Cardiology

## 2021-08-13 ENCOUNTER — Ambulatory Visit: Payer: BC Managed Care – PPO | Admitting: Cardiology

## 2021-08-15 ENCOUNTER — Other Ambulatory Visit: Payer: Self-pay | Admitting: Cardiology

## 2021-08-15 DIAGNOSIS — I2510 Atherosclerotic heart disease of native coronary artery without angina pectoris: Secondary | ICD-10-CM

## 2021-08-20 ENCOUNTER — Ambulatory Visit: Payer: BC Managed Care – PPO | Admitting: Cardiology

## 2021-08-20 ENCOUNTER — Encounter: Payer: Self-pay | Admitting: Cardiology

## 2021-08-20 VITALS — BP 123/74 | HR 64 | Temp 98.4°F | Resp 16 | Ht 63.0 in | Wt 216.0 lb

## 2021-08-20 DIAGNOSIS — E1159 Type 2 diabetes mellitus with other circulatory complications: Secondary | ICD-10-CM

## 2021-08-20 DIAGNOSIS — E782 Mixed hyperlipidemia: Secondary | ICD-10-CM

## 2021-08-20 DIAGNOSIS — I25118 Atherosclerotic heart disease of native coronary artery with other forms of angina pectoris: Secondary | ICD-10-CM

## 2021-08-20 NOTE — Progress Notes (Signed)
Patient is here for follow up visit.  Subjective:   '@Patient'  ID: Kristine Newman, female    DOB: [birth year], 46 y.o.   MRN: [id number]  Chief Complaint  Patient presents with   Coronary Artery Disease   Follow-up    30 month    46 year old African-American female with hypertension, hyperlipidemia, prediabetes, CAD w/ NSTEMI, PCI to LAD/Diag, RCA in 2018  Patient recently had one episode of retrosternal chest pressure, that persisted in spite of nitroglycerin. She has not had any recurrence. She is concerned given that she is hoping to increase her physical activity soon.    Current Outpatient Medications:    acetaminophen (TYLENOL) 500 MG tablet, Take 500 mg every 6 (six) hours as needed by mouth for mild pain., Disp: , Rfl:    aspirin 81 MG chewable tablet, Chew 81 mg by mouth daily., Disp: , Rfl:    atorvastatin (LIPITOR) 80 MG tablet, TAKE 1 TABLET BY MOUTH DAILY AT 6 PM., Disp: 90 tablet, Rfl: 3   cetirizine (ZYRTEC) 10 MG tablet, Take 10 mg by mouth daily., Disp: , Rfl:    Chlorpheniramine-APAP 2-325 MG TABS, Take 1 tablet by mouth as needed (Cold or flu)., Disp: , Rfl:    Cholecalciferol (VITAMIN D3) 125 MCG (5000 UT) TABS, Take 5,000 Units by mouth daily., Disp: , Rfl:    fluticasone (FLONASE) 50 MCG/ACT nasal spray, Place 2 sprays daily as needed into both nostrils for allergies. , Disp: , Rfl:    hydrOXYzine (ATARAX) 10 MG tablet, Take 10-20 mg by mouth every 8 (eight) hours as needed., Disp: , Rfl:    lisinopril (ZESTRIL) 10 MG tablet, TAKE 1 TABLET BY MOUTH EVERY DAY, Disp: 90 tablet, Rfl: 3   metoprolol succinate (TOPROL-XL) 50 MG 24 hr tablet, TAKE 1 TABLET BY MOUTH DAILY. TAKE WITH OR IMMEDIATELY FOLLOWING A MEAL., Disp: 90 tablet, Rfl: 3   nitroGLYCERIN (NITROSTAT) 0.4 MG SL tablet, Place 1 tablet (0.4 mg total) under the tongue every 5 (five) minutes as needed for chest pain., Disp: 30 tablet, Rfl: 3   pantoprazole (PROTONIX) 40 MG tablet, Take 40 mg by mouth daily.,  Disp: , Rfl:    PRALUENT 75 MG/ML SOAJ, INJECT 1 ML INTO THE SKIN EVERY 14 (FOURTEEN) DAYS., Disp: 6 mL, Rfl: 3   sertraline (ZOLOFT) 50 MG tablet, Take 50 mg by mouth at bedtime., Disp: , Rfl:   Cardiovascular studies:   EKG 08/20/2021: Sinus rhythm 60 bpm Low voltage in precordial leads Otherwise normal EKG  Coronary angiography 01/18/2020: LM: Normal LAD: Prox-mid LAD/Diag bifurcation stent patent with no restenosis LCx: Normal RCA: Patent prox RCA stent with no restenosis   LVEDP normal   Echocardiogram 10/12/2019:  Left ventricle cavity is normal in size and wall thickness. Normal global  wall motion. Normal LV systolic function with EF 54%. Normal diastolic  filling pattern.  Mild tricuspid regurgitation.  No evidence of pulmonary hypertension.  Lexiscan Sestamibi Stress Test 10/02/2019: Patient attempted treadmill exercise stress test, however the stress test was converted to [place] at 7 minutes due to chest pain and inability to achieve target heart rate.  Patient exercised for 7: 07 minutes and achieved 8.76 METS.  Normal blood pressure response.  Resting EKG normal sinus rhythm with nonspecific T flattening.  Stress EKG reveals inferior and lateral T wave inversion.  Stress EKG overall is non-diagnostic. Mild degree medium extent perfusion defect consistent with mild (reversible) ischemia located in the mid inferoseptal wall and basal inferoseptal wall (  Right Coronary Artery region) of left ventricle. Overall LV systolic function is normal without regional wall motion abnormalities. Stress LV EF: 56%.  No previous exam available for comparison. Intermediate risk study. The defect size is small and ischemia is very mild.   Coronary angiography/intervention 11/16/2016 and 11/18/2016: Severe LAD diagonal bifurcation 90% stenosis.   Moderate to severe proximal to mid RCA stenosis Resolute Onyx 3.0 X 30 mm mid LAD Synergy 2.5 X 20 mm Diag Synergy 3.0 X 32 mm prox-mid  RCA  Labs:  10/31/2020: Glucose 103, BUN/Cr 15/0.8. EGFR 87. Na/K 140/4.3. Rest of the CMP normal H/H 13/41. MCV 90. Platelets 239 HbA1C 6.8% Chol 115, TG 172, HDL 40, LDL 47  Review of Systems  Cardiovascular:  Positive for chest pain (Occasional). Negative for dyspnea on exertion, leg swelling, palpitations and syncope.  Respiratory:  Negative for shortness of breath.   Neurological:  Negative for dizziness.       Objective:    Vitals:   08/20/21 1259  BP: 123/74  Pulse: 64  Resp: 16  Temp: 98.4 F (36.9 C)  SpO2: 100%     Physical Exam Vitals and nursing note reviewed.  Constitutional:      General: She is not in acute distress.    Appearance: She is well-developed.  HENT:     Head: Normocephalic and atraumatic.  Neck:     Vascular: No JVD.  Cardiovascular:     Rate and Rhythm: Normal rate and regular rhythm.     Pulses: Intact distal pulses.     Heart sounds: Normal heart sounds, S1 normal and S2 normal. No murmur heard.    No gallop.  Pulmonary:     Effort: Pulmonary effort is normal. No respiratory distress.     Breath sounds: Normal breath sounds. No wheezing, rhonchi or rales.  Musculoskeletal:     Right lower leg: No edema.     Left lower leg: No edema.  Neurological:     Mental Status: She is alert.         Assessment & Recommendations:   46 year old African-American female with hypertension, hyperlipidemia, prediabetes, CAD w/ NSTEMI, PCI to LAD/Diag, RCA in 2018  CAD: S/p multivessel PCI for NSTEMI 08/2016. Patent stents and no obstructive CAD cath 01/18/2020 Occasional chest pain episodes. Reasonable to obtain exercise nuclear stress test prior to increasing phyical activity. Continue Aspirin, statin, Praluent, metoprolol.  Hypertension: Controlled  Hyperlipidemia: Lipids very well controlled. Continue lipitor and Praluent.   Has f/u w/PCP soon. If diabetic, consider SGLT2i (such as Jardiance/Farxiga) or GLP-1 (such as  Ozempic)  F/u in 6 months or sooner, if increasing of nitroglycerin   [employer], PA-C 08/20/2021, 9:04 AM Office: [phone]

## 2021-09-19 ENCOUNTER — Other Ambulatory Visit: Payer: Self-pay | Admitting: Cardiology

## 2021-10-17 ENCOUNTER — Encounter: Payer: Self-pay | Admitting: Cardiology

## 2021-12-29 ENCOUNTER — Other Ambulatory Visit: Payer: BC Managed Care – PPO

## 2022-01-05 ENCOUNTER — Other Ambulatory Visit: Payer: Self-pay | Admitting: Cardiology

## 2022-01-05 ENCOUNTER — Ambulatory Visit: Payer: BC Managed Care – PPO

## 2022-01-05 DIAGNOSIS — I25118 Atherosclerotic heart disease of native coronary artery with other forms of angina pectoris: Secondary | ICD-10-CM

## 2022-01-07 ENCOUNTER — Other Ambulatory Visit: Payer: Self-pay

## 2022-01-07 MED ORDER — REPATHA SURECLICK 140 MG/ML ~~LOC~~ SOAJ: 1.0000 mL | SUBCUTANEOUS | 3 refills | Status: DC

## 2022-02-19 ENCOUNTER — Other Ambulatory Visit: Payer: Self-pay | Admitting: Family Medicine

## 2022-02-19 DIAGNOSIS — Z1231 Encounter for screening mammogram for malignant neoplasm of breast: Secondary | ICD-10-CM

## 2022-02-20 ENCOUNTER — Ambulatory Visit: Payer: BC Managed Care – PPO | Admitting: Cardiology

## 2022-02-23 ENCOUNTER — Encounter: Payer: Self-pay | Admitting: Cardiology

## 2022-02-23 ENCOUNTER — Ambulatory Visit: Payer: BC Managed Care – PPO | Admitting: Cardiology

## 2022-02-23 VITALS — BP 126/81 | HR 71 | Resp 16 | Ht 63.0 in | Wt 205.0 lb

## 2022-02-23 DIAGNOSIS — I25118 Atherosclerotic heart disease of native coronary artery with other forms of angina pectoris: Secondary | ICD-10-CM

## 2022-02-23 DIAGNOSIS — I2510 Atherosclerotic heart disease of native coronary artery without angina pectoris: Secondary | ICD-10-CM

## 2022-02-23 DIAGNOSIS — E782 Mixed hyperlipidemia: Secondary | ICD-10-CM

## 2022-02-23 NOTE — Progress Notes (Signed)
Patient is here for follow up visit.  Subjective:   @Patient  ID: Kristine Newman, female    DOB: Oct 26, 1975, 47 y.o.   MRN: [id number]  Chief Complaint  Patient presents with   Coronary Artery Disease   Follow-up    33 month    47 year old African-American female with hypertension, hyperlipidemia, prediabetes, CAD   Patient underwent multivessel PCI for CAD, NSTEMI in 2018. Patient is doing well. She denies chest pain, shortness of breath, palpitations, leg edema, orthopnea, PND, TIA/syncope. She is starting regular exercise.   Reviewed recent test results with the patient, details below.     Current Outpatient Medications:    acetaminophen (TYLENOL) 500 MG tablet, Take 500 mg every 6 (six) hours as needed by mouth for mild pain., Disp: , Rfl:    aspirin 81 MG chewable tablet, Chew 81 mg by mouth daily., Disp: , Rfl:    atorvastatin (LIPITOR) 80 MG tablet, TAKE 1 TABLET BY MOUTH DAILY AT 6 PM., Disp: 90 tablet, Rfl: 3   cetirizine (ZYRTEC) 10 MG tablet, Take 10 mg by mouth daily., Disp: , Rfl:    Cholecalciferol (VITAMIN D3) 125 MCG (5000 UT) TABS, Take 5,000 Units by mouth daily., Disp: , Rfl:    Evolocumab (REPATHA SURECLICK) 093 MG/ML SOAJ, Inject 140 mg into the skin every 14 (fourteen) days., Disp: 2 mL, Rfl: 3   fluticasone (FLONASE) 50 MCG/ACT nasal spray, Place 2 sprays daily as needed into both nostrils for allergies. , Disp: , Rfl:    glipiZIDE (GLUCOTROL XL) 5 MG 24 hr tablet, Take 5 mg by mouth daily., Disp: , Rfl:    hydrOXYzine (ATARAX) 10 MG tablet, Take 10-20 mg by mouth every 8 (eight) hours as needed., Disp: , Rfl:    lisinopril (ZESTRIL) 10 MG tablet, TAKE 1 TABLET BY MOUTH EVERY DAY, Disp: 90 tablet, Rfl: 3   metoprolol succinate (TOPROL-XL) 50 MG 24 hr tablet, TAKE 1 TABLET BY MOUTH DAILY. TAKE WITH OR IMMEDIATELY FOLLOWING A MEAL., Disp: 90 tablet, Rfl: 3   MOUNJARO 5 MG/0.5ML Pen, Inject 5 mg into the skin once a week., Disp: , Rfl:    nitroGLYCERIN  (NITROSTAT) 0.4 MG SL tablet, Place 1 tablet (0.4 mg total) under the tongue every 5 (five) minutes as needed for chest pain., Disp: 30 tablet, Rfl: 3   pantoprazole (PROTONIX) 40 MG tablet, Take 40 mg by mouth daily., Disp: , Rfl:    sertraline (ZOLOFT) 50 MG tablet, Take 50 mg by mouth at bedtime., Disp: , Rfl:    Chlorpheniramine-APAP 2-325 MG TABS, Take 1 tablet by mouth as needed (Cold or flu). (Patient not taking: Reported on 02/23/2022), Disp: , Rfl:   Cardiovascular studies:   EKG 02/23/2022: Sinus rhythm 66 bpm Nonspecific ST-T abnormality  Exercise nuclear stress test 01/05/2022: Myocardial perfusion is normal. Some soft tissue attenuation noted. Overall LV systolic function is normal without regional wall motion abnormalities. Stress LV EF: 54%. Low risk study. Normal ECG stress. The patient exercised for 6 minutes and 4 seconds of a Bruce protocol, achieving approximately 7.11 METs and reached 87% of MPHR. The heart rate response was normal. The blood pressure response was normal. No previous exam available for comparison.   Coronary angiography 01/18/2020: LM: Normal LAD: Prox-mid LAD/Diag bifurcation stent patent with no restenosis LCx: Normal RCA: Patent prox RCA stent with no restenosis   LVEDP normal   Echocardiogram 10/12/2019:  Left ventricle cavity is normal in size and wall thickness. Normal global  wall  motion. Normal LV systolic function with EF 54%. Normal diastolic  filling pattern.  Mild tricuspid regurgitation.  No evidence of pulmonary hypertension.  Coronary angiography/intervention 11/16/2016 and 11/18/2016: Severe LAD diagonal bifurcation 90% stenosis.   Moderate to severe proximal to mid RCA stenosis Resolute Onyx 3.0 X 30 mm mid LAD Synergy 2.5 X 20 mm Diag Synergy 3.0 X 32 mm prox-mid RCA  Labs:  02/04/2022: Glucose 194, BUN/Cr 11/0.8. EGFR 83. K 4.2 Hb 14 HbA1C 8.4% Chol 97, TG 211, HDL 38, LDL 25  10/31/2020: Glucose 103, BUN/Cr 15/0.8.  EGFR 87. Na/K 140/4.3. Rest of the CMP normal H/H 13/41. MCV 90. Platelets 239 HbA1C 6.8% Chol 115, TG 172, HDL 40, LDL 47  Review of Systems  Cardiovascular:  Positive for chest pain (Occasional). Negative for dyspnea on exertion, leg swelling, palpitations and syncope.  Respiratory:  Negative for shortness of breath.   Neurological:  Negative for dizziness.       Objective:    Vitals:   02/23/22 1315  BP: 126/81  Pulse: 71  Resp: 16  SpO2: 98%     Physical Exam Vitals and nursing note reviewed.  Constitutional:      General: She is not in acute distress. Neck:     Vascular: No JVD.  Cardiovascular:     Rate and Rhythm: Normal rate and regular rhythm.     Heart sounds: Normal heart sounds. No murmur heard. Pulmonary:     Effort: Pulmonary effort is normal.     Breath sounds: Normal breath sounds. No wheezing or rales.         Assessment & Recommendations:   47 year old African-American female with hypertension, hyperlipidemia, prediabetes, CAD   CAD: S/p multivessel PCI for NSTEMI 08/2016. Patent stents and no obstructive CAD cath 12/2019. No recent angina. No ischemia stress testing (12/2021), Continue Aspirin, statin, Praluent. Given complete revascularization and no significant residual CAD, and her wanting to reduce her pill burden if possible, okay to come off metoprolol. Patient can reduce metoprolol to 1/2 tab a day, then 1/2 tab every other day, then stop. She has f/u w/PCP Dr. Mannie Stabile in April. If blood pressure uncontrolled at that time, could consider increasing lisinopril to 20 mg or add amlodipine.   Hypertension: Controlled  Hyperlipidemia: Lipids very well controlled. Continue lipitor and Praluent.   Type 2 DML Uncontrolled. Recently started on Mounjaro. F/u w/PCP.  F/u in 1 year    [employer], PA-C 02/23/2022, 1:01 PM Office: [phone]

## 2022-03-20 ENCOUNTER — Other Ambulatory Visit: Payer: Self-pay | Admitting: Cardiology

## 2022-03-24 ENCOUNTER — Other Ambulatory Visit: Payer: Self-pay

## 2022-03-24 MED ORDER — ATORVASTATIN CALCIUM 80 MG PO TABS: ORAL_TABLET | ORAL | 3 refills | Status: DC

## 2022-03-27 ENCOUNTER — Ambulatory Visit
Admission: RE | Admit: 2022-03-27 | Discharge: 2022-03-27 | Disposition: A | Discharge status: Home / Self Care | Payer: BC Managed Care – PPO | Source: Ambulatory Visit | Attending: Family Medicine | Admitting: Family Medicine

## 2022-03-27 ENCOUNTER — Ambulatory Visit: Payer: BC Managed Care – PPO

## 2022-03-27 DIAGNOSIS — Z1231 Encounter for screening mammogram for malignant neoplasm of breast: Secondary | ICD-10-CM

## 2022-03-31 ENCOUNTER — Other Ambulatory Visit: Payer: Self-pay | Admitting: Cardiology

## 2022-04-01 ENCOUNTER — Other Ambulatory Visit: Payer: Self-pay | Admitting: Family Medicine

## 2022-04-01 DIAGNOSIS — R928 Other abnormal and inconclusive findings on diagnostic imaging of breast: Secondary | ICD-10-CM

## 2022-04-14 ENCOUNTER — Ambulatory Visit
Admission: RE | Admit: 2022-04-14 | Discharge: 2022-04-14 | Disposition: A | Discharge status: Home / Self Care | Payer: BC Managed Care – PPO | Source: Ambulatory Visit | Attending: Family Medicine | Admitting: Family Medicine

## 2022-04-14 ENCOUNTER — Ambulatory Visit: Payer: BC Managed Care – PPO

## 2022-04-14 DIAGNOSIS — R928 Other abnormal and inconclusive findings on diagnostic imaging of breast: Secondary | ICD-10-CM

## 2022-05-21 ENCOUNTER — Ambulatory Visit: Payer: BC Managed Care – PPO | Admitting: Internal Medicine

## 2022-05-21 ENCOUNTER — Encounter: Payer: Self-pay | Admitting: Internal Medicine

## 2022-05-21 VITALS — BP 138/92 | HR 66 | Ht 63.0 in | Wt 200.0 lb

## 2022-05-21 DIAGNOSIS — I25118 Atherosclerotic heart disease of native coronary artery with other forms of angina pectoris: Secondary | ICD-10-CM

## 2022-05-21 NOTE — Progress Notes (Signed)
Patient is here for follow up visit.  Subjective:   @Patient  ID: Kristine Newman, female    DOB: February 11, 1975, 47 y.o.   MRN: [id number]  Chief Complaint  Patient presents with   Coronary Artery Disease   Follow-up   Chest Pain    47 year old African-American female with hypertension, hyperlipidemia, prediabetes, CAD.  Patient underwent multivessel PCI for CAD, NSTEMI in 2018. Patient presents today for acute visit due to chest pain and weakness. She did cut her metoprolol down to 25 mg at the last office visit which could be causing her increased chest pain. Given her history of multivessel CAD, patient is agreeable to repeating stress test and echocardiogram. She is comfortable going back on her previous dose of metoprolol. She denies shortness of breath, palpitations, diaphoresis, syncope.  Reviewed recent test results with the patient, details below.     Current Outpatient Medications:    acetaminophen (TYLENOL) 500 MG tablet, Take 500 mg every 6 (six) hours as needed by mouth for mild pain., Disp: , Rfl:    aspirin 81 MG chewable tablet, Chew 81 mg by mouth daily., Disp: , Rfl:    atorvastatin (LIPITOR) 80 MG tablet, TAKE 1 TABLET BY MOUTH DAILY AT 6 PM., Disp: 90 tablet, Rfl: 3   cetirizine (ZYRTEC) 10 MG tablet, Take 10 mg by mouth daily., Disp: , Rfl:    Chlorpheniramine-APAP 2-325 MG TABS, Take 1 tablet by mouth as needed (Cold or flu)., Disp: , Rfl:    empagliflozin (JARDIANCE) 10 MG TABS tablet, Take 10 mg by mouth daily., Disp: , Rfl:    Evolocumab (REPATHA SURECLICK) 140 MG/ML SOAJ, INJECT 140 MG INTO THE SKIN EVERY 14 (FOURTEEN) DAYS., Disp: 6 mL, Rfl: 3   fluticasone (FLONASE) 50 MCG/ACT nasal spray, Place 2 sprays daily as needed into both nostrils for allergies. , Disp: , Rfl:    hydrOXYzine (ATARAX) 10 MG tablet, Take 10-20 mg by mouth every 8 (eight) hours as needed., Disp: , Rfl:    lisinopril (ZESTRIL) 10 MG tablet, TAKE 1 TABLET BY MOUTH EVERY DAY, Disp: 90  tablet, Rfl: 3   metoprolol succinate (TOPROL-XL) 50 MG 24 hr tablet, TAKE 1 TABLET BY MOUTH DAILY. TAKE WITH OR IMMEDIATELY FOLLOWING A MEAL. (Patient taking differently: Take 25 mg by mouth daily. TAKE WITH OR IMMEDIATELY FOLLOWING A MEAL.), Disp: 90 tablet, Rfl: 3   nitroGLYCERIN (NITROSTAT) 0.4 MG SL tablet, PLACE 1 TABLET UNDER THE TONGUE EVERY 5 MINUTES AS NEEDED FOR CHEST PAIN., Disp: 25 tablet, Rfl: 3   sertraline (ZOLOFT) 50 MG tablet, Take 50 mg by mouth at bedtime., Disp: , Rfl:    glipiZIDE (GLUCOTROL XL) 5 MG 24 hr tablet, Take 5 mg by mouth daily. (Patient not taking: Reported on 05/21/2022), Disp: , Rfl:    MOUNJARO 5 MG/0.5ML Pen, Inject 5 mg into the skin once a week. (Patient not taking: Reported on 05/21/2022), Disp: , Rfl:    pantoprazole (PROTONIX) 40 MG tablet, Take 40 mg by mouth daily., Disp: , Rfl:   Cardiovascular studies:   EKG 02/23/2022: Sinus rhythm 66 bpm Nonspecific ST-T abnormality  05/21/2022: normal sinus rhythm, poor R wave progression. No evidence of ischemia.  Exercise nuclear stress test 01/05/2022: Myocardial perfusion is normal. Some soft tissue attenuation noted. Overall LV systolic function is normal without regional wall motion abnormalities. Stress LV EF: 54%. Low risk study. Normal ECG stress. The patient exercised for 6 minutes and 4 seconds of a Bruce protocol, achieving approximately 7.11 METs  and reached 87% of MPHR. The heart rate response was normal. The blood pressure response was normal. No previous exam available for comparison.   Coronary angiography 01/18/2020: LM: Normal LAD: Prox-mid LAD/Diag bifurcation stent patent with no restenosis LCx: Normal RCA: Patent prox RCA stent with no restenosis   LVEDP normal   Echocardiogram 10/12/2019:  Left ventricle cavity is normal in size and wall thickness. Normal global  wall motion. Normal LV systolic function with EF 54%. Normal diastolic  filling pattern.  Mild tricuspid regurgitation.  No  evidence of pulmonary hypertension.  Coronary angiography/intervention 11/16/2016 and 11/18/2016: Severe LAD diagonal bifurcation 90% stenosis.   Moderate to severe proximal to mid RCA stenosis Resolute Onyx 3.0 X 30 mm mid LAD Synergy 2.5 X 20 mm Diag Synergy 3.0 X 32 mm prox-mid RCA  Labs:  02/04/2022: Glucose 194, BUN/Cr 11/0.8. EGFR 83. K 4.2 Hb 14 HbA1C 8.4% Chol 97, TG 211, HDL 38, LDL 25  10/31/2020: Glucose 103, BUN/Cr 15/0.8. EGFR 87. Na/K 140/4.3. Rest of the CMP normal H/H 13/41. MCV 90. Platelets 239 HbA1C 6.8% Chol 115, TG 172, HDL 40, LDL 47  Review of Systems  Cardiovascular:  Positive for chest pain. Negative for dyspnea on exertion, leg swelling, palpitations and syncope.  Respiratory:  Negative for shortness of breath.   Neurological:  Positive for weakness. Negative for dizziness.       Objective:    Vitals:   05/21/22 1005 05/21/22 1013  BP: (!) 144/81 (!) 138/92  Pulse: 69 66  SpO2: 100% 99%     Physical Exam Vitals and nursing note reviewed.  Constitutional:      General: She is not in acute distress. Neck:     Vascular: No JVD.  Cardiovascular:     Rate and Rhythm: Normal rate and regular rhythm.     Heart sounds: Normal heart sounds. No murmur heard. Pulmonary:     Effort: Pulmonary effort is normal.     Breath sounds: Normal breath sounds. No wheezing or rales.         Assessment & Recommendations:   47 year old African-American female with hypertension, hyperlipidemia, prediabetes, CAD   CAD with chest pain: S/p multivessel PCI for NSTEMI 08/2016. Patent stents and no obstructive CAD cath 12/2019. No ischemia stress testing (12/2021), Continue Aspirin, statin, Praluent. Last visit she reduced her metoprolol to 25 mg qDay, I have advised her to go back to 50 mg qDay Stress test and echocardiogram ordered given new chest pain and weakness No ischemia on today's EKG She has SL nitro to take PRN Follow-up in 4 weeks or sooner if  needed     Clotilde Dieter, DO 05/21/2022, 10:32 AM Office: [phone]

## 2022-05-28 ENCOUNTER — Ambulatory Visit: Payer: BC Managed Care – PPO

## 2022-05-28 DIAGNOSIS — I25118 Atherosclerotic heart disease of native coronary artery with other forms of angina pectoris: Secondary | ICD-10-CM

## 2022-05-31 ENCOUNTER — Other Ambulatory Visit: Payer: Self-pay | Admitting: Cardiology

## 2022-05-31 LAB — PCV MYOCARDIAL PERFUSION WO LEXISCAN
Angina Index: 0
Base ST Depression (mm): 0 mm
ST Depression (mm): 0 mm

## 2022-06-01 NOTE — Progress Notes (Signed)
Can this pt come in sometime this week

## 2022-06-02 NOTE — Progress Notes (Signed)
Patient needs to be seen by custovic this week please

## 2022-06-16 NOTE — Progress Notes (Signed)
Does she have an appt her stress test is positive she needs to come in

## 2022-06-17 NOTE — Progress Notes (Signed)
6/13 with mp

## 2022-06-24 ENCOUNTER — Ambulatory Visit: Payer: BC Managed Care – PPO

## 2022-06-24 DIAGNOSIS — I25118 Atherosclerotic heart disease of native coronary artery with other forms of angina pectoris: Secondary | ICD-10-CM

## 2022-07-02 ENCOUNTER — Ambulatory Visit: Payer: BC Managed Care – PPO | Admitting: Cardiology

## 2022-07-06 ENCOUNTER — Encounter: Payer: Self-pay | Admitting: Cardiology

## 2022-07-06 ENCOUNTER — Ambulatory Visit: Payer: BC Managed Care – PPO | Admitting: Cardiology

## 2022-07-06 VITALS — BP 126/71 | HR 56 | Ht 63.0 in | Wt 202.0 lb

## 2022-07-06 DIAGNOSIS — E782 Mixed hyperlipidemia: Secondary | ICD-10-CM

## 2022-07-06 DIAGNOSIS — I10 Essential (primary) hypertension: Secondary | ICD-10-CM

## 2022-07-06 DIAGNOSIS — E1159 Type 2 diabetes mellitus with other circulatory complications: Secondary | ICD-10-CM

## 2022-07-06 DIAGNOSIS — I25118 Atherosclerotic heart disease of native coronary artery with other forms of angina pectoris: Secondary | ICD-10-CM

## 2022-07-06 NOTE — Progress Notes (Signed)
Patient is here for follow up visit.  Subjective:   @Patient  ID: Kristine Newman, female    DOB: Jun 15, 1975, 47 y.o.   MRN: [id number]  Chief Complaint  Patient presents with   Coronary artery disease of native artery of native heart wi   Follow-up    47 year old African-American female with hypertension, hyperlipidemia, prediabetes, CAD   Patient underwent multivessel PCI for CAD, NSTEMI in 2018.  Patient was recently seen by partner a month ago, with complaints of chest pain.  Stress test then ordered detailed below.  At this time, patient does not have any exertional chest pain.  She walks 30 minutes 3 times a week, without any symptoms.  Chest pain episodes usually occur at rest, go away within a few minutes if her mind gets dilated to something else.  She is compliant with her medical therapy.  Lipids very well-controlled.    Current Outpatient Medications:    acetaminophen (TYLENOL) 500 MG tablet, Take 500 mg every 6 (six) hours as needed by mouth for mild pain., Disp: , Rfl:    aspirin 81 MG chewable tablet, Chew 81 mg by mouth daily., Disp: , Rfl:    atorvastatin (LIPITOR) 80 MG tablet, TAKE 1 TABLET BY MOUTH DAILY AT 6 PM., Disp: 90 tablet, Rfl: 3   cetirizine (ZYRTEC) 10 MG tablet, Take 10 mg by mouth daily., Disp: , Rfl:    Chlorpheniramine-APAP 2-325 MG TABS, Take 1 tablet by mouth as needed (Cold or flu)., Disp: , Rfl:    Continuous Glucose Sensor (FREESTYLE LIBRE 3 SENSOR) MISC, AS DIRECTED 90 DAYS, Disp: , Rfl:    empagliflozin (JARDIANCE) 10 MG TABS tablet, Take 10 mg by mouth daily., Disp: , Rfl:    Evolocumab (REPATHA SURECLICK) 140 MG/ML SOAJ, INJECT 140 MG INTO THE SKIN EVERY 14 (FOURTEEN) DAYS., Disp: 6 mL, Rfl: 3   fluticasone (FLONASE) 50 MCG/ACT nasal spray, Place 2 sprays daily as needed into both nostrils for allergies. , Disp: , Rfl:    hydrOXYzine (ATARAX) 10 MG tablet, Take 10-20 mg by mouth every 8 (eight) hours as needed., Disp: , Rfl:    lisinopril  (ZESTRIL) 10 MG tablet, TAKE 1 TABLET BY MOUTH EVERY DAY, Disp: 90 tablet, Rfl: 3   metoprolol succinate (TOPROL-XL) 50 MG 24 hr tablet, TAKE 1 TABLET BY MOUTH DAILY. TAKE WITH OR IMMEDIATELY FOLLOWING A MEAL. (Patient taking differently: Take 25 mg by mouth daily. TAKE WITH OR IMMEDIATELY FOLLOWING A MEAL.), Disp: 90 tablet, Rfl: 3   nitroGLYCERIN (NITROSTAT) 0.4 MG SL tablet, PLACE 1 TABLET UNDER THE TONGUE EVERY 5 MINUTES AS NEEDED FOR CHEST PAIN., Disp: 25 tablet, Rfl: 3   pantoprazole (PROTONIX) 40 MG tablet, Take 40 mg by mouth daily., Disp: , Rfl:    sertraline (ZOLOFT) 50 MG tablet, Take 50 mg by mouth at bedtime., Disp: , Rfl:   Cardiovascular studies:   EKG 02/23/2022: Sinus rhythm 66 bpm Nonspecific ST-T abnormality  Echocardiogram 06/24/2022: Normal LV systolic function with visual EF 60-65%. Left ventricle cavity is normal in size. Normal left ventricular wall thickness. Normal global wall motion. Normal diastolic filling pattern, normal LAP. Left atrial cavity is normal in size. 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 No significant valvular heart disease. Compared to 10/12/2019 no significant change.  Exercise nuclear stress test 05/28/2022: Myocardial perfusion is abnormal. There is a small to medium sized reversible mild defect in the inferior, septal and apical regions.  Overall function is abnormal  with hypokinesis in the infero-septal region.  Stress LV EF low normal: 51%.  Normal ECG stress. The patient exercised for 6 minutes and 41 seconds of a Bruce protocol, achieving approximately 8.11 METs & 89% MPHR.  The baseline blood pressure was 140/100 mmHg and increased to 200/80 mmHg, which is a normal response to exercise. Symptoms included fatigue.  In comparison 01/05/2022, inferior infero-septal ischemia is new.   Intermediate risk study.     Coronary angiography 01/30/2020: LM: Normal LAD: Prox-mid LAD/Diag bifurcation stent patent with no  restenosis LCx: Normal RCA: Patent prox RCA stent with no restenosis   LVEDP normal   Echocardiogram 10/12/2019:  Left ventricle cavity is normal in size and wall thickness. Normal global  wall motion. Normal LV systolic function with EF 54%. Normal diastolic  filling pattern.  Mild tricuspid regurgitation.  No evidence of pulmonary hypertension.  Coronary angiography/intervention 11/16/2016 and 11/18/2016: Severe LAD diagonal bifurcation 90% stenosis.   Moderate to severe proximal to mid RCA stenosis Resolute Onyx 3.0 X 30 mm mid LAD Synergy 2.5 X 20 mm Diag Synergy 3.0 X 32 mm prox-mid RCA  Labs:  02/04/2022: Glucose 194, BUN/Cr 11/0.8. EGFR 83. K 4.2 Hb 14 HbA1C 8.4% Chol 97, TG 211, HDL 38, LDL 25  10/31/2020: Glucose 103, BUN/Cr 15/0.8. EGFR 87. Na/K 140/4.3. Rest of the CMP normal H/H 13/41. MCV 90. Platelets 239 HbA1C 6.8% Chol 115, TG 172, HDL 40, LDL 47  Review of Systems  Cardiovascular:  Positive for chest pain (Occasional). Negative for dyspnea on exertion, leg swelling, palpitations and syncope.  Respiratory:  Negative for shortness of breath.   Neurological:  Negative for dizziness.       Objective:    Vitals:   07/06/22 1209  BP: 126/71  Pulse: (!) 56  SpO2: 98%     Physical Exam Vitals and nursing note reviewed.  Constitutional:      General: She is not in acute distress. Neck:     Vascular: No JVD.  Cardiovascular:     Rate and Rhythm: Normal rate and regular rhythm.     Heart sounds: Normal heart sounds. No murmur heard. Pulmonary:     Effort: Pulmonary effort is normal.     Breath sounds: Normal breath sounds. No wheezing or rales.  Musculoskeletal:     Right lower leg: No edema.     Left lower leg: No edema.         Assessment & Recommendations:   48 year old African-American female with hypertension, hyperlipidemia, prediabetes, CAD   CAD: S/p multivessel PCI for NSTEMI 08/2016. Patent stents and no obstructive CAD cath  12/2019. Recent chest pain symptoms unlikely to be anginal. Reviewed stress test from 05/2022 that suggest possible anteroseptal ischemia. However, in absence of true anginal symptoms, I do not think she warrants heart catheterization at this time. Continue current management for CAD.  If she has any true anginal symptoms, we will then proceed with heart catheterization.  Hypertension: Controlled  Hyperlipidemia: Lipids very well controlled. Continue lipitor and Praluent.   Type 2 DML Uncontrolled. Recently started on Mounjaro. F/u w/PCP.  F/u in 3 months     [employer], PA-C 07/06/2022, 12:14 PM Office: [phone]

## 2022-08-26 ENCOUNTER — Other Ambulatory Visit: Payer: Self-pay | Admitting: Nurse Practitioner

## 2022-08-26 ENCOUNTER — Other Ambulatory Visit (HOSPITAL_COMMUNITY)
Admission: RE | Admit: 2022-08-26 | Discharge: 2022-08-26 | Disposition: A | Discharge status: Home / Self Care | Payer: BC Managed Care – PPO | Source: Ambulatory Visit | Attending: Nurse Practitioner | Admitting: Nurse Practitioner

## 2022-08-26 DIAGNOSIS — Z124 Encounter for screening for malignant neoplasm of cervix: Secondary | ICD-10-CM | POA: Insufficient documentation

## 2022-08-27 LAB — CYTOLOGY - PAP
Comment: NEGATIVE
Diagnosis: NEGATIVE
High risk HPV: NEGATIVE

## 2022-08-30 ENCOUNTER — Other Ambulatory Visit: Payer: Self-pay | Admitting: Cardiology

## 2022-08-30 DIAGNOSIS — I2510 Atherosclerotic heart disease of native coronary artery without angina pectoris: Secondary | ICD-10-CM

## 2022-10-07 ENCOUNTER — Ambulatory Visit: Payer: BC Managed Care – PPO | Admitting: Cardiology

## 2022-10-12 ENCOUNTER — Ambulatory Visit: Payer: BC Managed Care – PPO | Admitting: Cardiology

## 2022-12-27 ENCOUNTER — Other Ambulatory Visit: Payer: Self-pay | Admitting: Cardiology

## 2022-12-30 ENCOUNTER — Other Ambulatory Visit (HOSPITAL_COMMUNITY): Payer: Self-pay

## 2022-12-30 ENCOUNTER — Telehealth: Payer: Self-pay | Admitting: Pharmacy Technician

## 2022-12-30 NOTE — Telephone Encounter (Signed)
Pharmacy Patient Advocate Encounter   Received notification from Fax that prior authorization for repatha is required/requested.   Insurance verification completed.   The patient is insured through [facility] ADVANTAGE/RX ADVANCE .   Per test claim: The current 12/30/22 day co-pay is, $90.00- 3 months.  No PA needed at this time. This test claim was processed through [facility]- copay amounts may vary at other pharmacies due to pharmacy/plan contracts, or as the patient moves through the different stages of their insurance plan.

## 2023-02-24 ENCOUNTER — Ambulatory Visit: Payer: Self-pay | Admitting: Cardiology

## 2023-04-16 ENCOUNTER — Other Ambulatory Visit: Payer: Self-pay | Admitting: Family Medicine

## 2023-04-16 DIAGNOSIS — Z1231 Encounter for screening mammogram for malignant neoplasm of breast: Secondary | ICD-10-CM

## 2023-04-20 ENCOUNTER — Ambulatory Visit
Admission: RE | Admit: 2023-04-20 | Discharge: 2023-04-20 | Disposition: A | Discharge status: Home / Self Care | Payer: Self-pay | Source: Ambulatory Visit | Attending: Family Medicine

## 2023-04-20 DIAGNOSIS — Z1231 Encounter for screening mammogram for malignant neoplasm of breast: Secondary | ICD-10-CM

## 2023-05-08 ENCOUNTER — Other Ambulatory Visit: Payer: Self-pay | Admitting: Cardiology

## 2023-05-12 ENCOUNTER — Other Ambulatory Visit (HOSPITAL_COMMUNITY): Payer: Self-pay

## 2023-05-12 ENCOUNTER — Telehealth: Payer: Self-pay | Admitting: Pharmacy Technician

## 2023-05-12 NOTE — Telephone Encounter (Signed)
 Pharmacy Patient Advocate Encounter  Received notification from [employer] that Prior Authorization for Repatha  has been APPROVED from 05/12/23 to 05/11/24. Unable to obtain price due to refill too soon rejection, last fill date 05/12/23 next available fill date06/25/25   PA #/Case ID/Reference #: [id number]

## 2023-05-12 NOTE — Telephone Encounter (Signed)
 Pharmacy Patient Advocate Encounter   Received notification from CoverMyMeds that prior authorization for Repatha  is required/requested.   Insurance verification completed.   The patient is insured through [facility] ADVANTAGE/RX ADVANCE .   Per test claim: PA required; PA submitted to above mentioned insurance via CoverMyMeds Key/confirmation #/EOC [id number] Status is pending

## 2023-05-28 ENCOUNTER — Encounter: Payer: Self-pay | Admitting: Cardiology

## 2023-05-28 ENCOUNTER — Ambulatory Visit: Payer: Self-pay | Attending: Cardiology | Admitting: Cardiology

## 2023-05-28 VITALS — BP 117/80 | HR 64 | Resp 16 | Ht 63.0 in | Wt 206.6 lb

## 2023-05-28 DIAGNOSIS — E782 Mixed hyperlipidemia: Secondary | ICD-10-CM

## 2023-05-28 DIAGNOSIS — I25118 Atherosclerotic heart disease of native coronary artery with other forms of angina pectoris: Secondary | ICD-10-CM

## 2023-05-28 DIAGNOSIS — E1159 Type 2 diabetes mellitus with other circulatory complications: Secondary | ICD-10-CM

## 2023-05-28 DIAGNOSIS — I2510 Atherosclerotic heart disease of native coronary artery without angina pectoris: Secondary | ICD-10-CM | POA: Diagnosis not present

## 2023-05-28 MED ORDER — METOPROLOL SUCCINATE ER 50 MG PO TB24: 50.0000 mg | ORAL_TABLET | Freq: Every day | ORAL | 3 refills | Status: AC

## 2023-05-28 NOTE — Patient Instructions (Signed)
 Follow-Up: At [facility], you and your health needs are our priority.  As part of our continuing mission to provide you with exceptional heart care, our providers are all part of one team.  This team includes your primary Cardiologist (physician) and Advanced Practice Providers or APPs (Physician Assistants and Nurse Practitioners) who all work together to provide you with the care you need, when you need it.  Your next appointment:   1 year(s)  Provider:   Cody Das, MD    We recommend signing up for the patient portal called "MyChart".  Sign up information is provided on this After Visit Summary.  MyChart is used to connect with patients for Virtual Visits (Telemedicine).  Patients are able to view lab/test results, encounter notes, upcoming appointments, etc.  Non-urgent messages can be sent to your provider as well.   To learn more about what you can do with MyChart, go to [email or web address].

## 2023-05-28 NOTE — Progress Notes (Signed)
 Cardiology Office Note:  .   Date:  05/28/2023  ID:  Kristine Newman, DOB 1975-12-24, MRN [id number] PCP: Dorena Gander, MD  [facility] Providers Cardiologist:  Fransico Ivy, MD PCP: Dorena Gander, MD  Chief Complaint  Patient presents with   Coronary artery disease of native artery of native heart wi   Follow-up    10 months     Kristine Newman is a 48 y.o. female with hypertension, hyperlipidemia, type 2 DM, CAD     History of Present Illness  Patient is doing well without any chest pain.  She walks 30 minutes most days of the week without any complaints.  However, she admits to recent dietary indiscretions which have resulted in A1c increasing from 6.9% last year to 7.3% now.  She is following up with her PCP Dr. Regular close regarding the same.     Vitals:   05/28/23 1103  BP: 117/80  Pulse: 64  Resp: 16  SpO2: 95%      Review of Systems  Cardiovascular:  Negative for chest pain, dyspnea on exertion, leg swelling, palpitations and syncope.        Studies Reviewed: Aaron Aas        EKG 05/28/2023: Normal sinus rhythm Minimal voltage criteria for LVH, may be normal variant ( R in aVL ) Nonspecific T wave abnormality When compared with ECG of 05-Sep-2019 10:47, Nonspecific T wave abnormality now evident in Lateral leads    Independently interpreted 7-11/2022: Chol 121, TG 182, HDL 41, LDL 50 HbA1C 6.9% Hb 14.7   Physical Exam Vitals and nursing note reviewed.  Constitutional:      General: She is not in acute distress. Neck:     Vascular: No JVD.  Cardiovascular:     Rate and Rhythm: Normal rate and regular rhythm.     Heart sounds: Normal heart sounds. No murmur heard. Pulmonary:     Effort: Pulmonary effort is normal.     Breath sounds: Normal breath sounds. No wheezing or rales.  Musculoskeletal:     Right lower leg: No edema.     Left lower leg: No edema.      VISIT DIAGNOSES:   ICD-10-CM   1. Coronary artery disease of native  artery of native heart with stable angina pectoris (HCC)  I25.118 EKG 12-Lead    2. Mixed hyperlipidemia  E78.2     3. Type 2 diabetes mellitus with other circulatory complication, without long-term current use of insulin (HCC)  E11.59        Kristine Newman is a 48 y.o. female with hypertension, hyperlipidemia, type 2 DM, CAD    Assessment & Plan  CAD: S/p multivessel PCI for NSTEMI 2018. Patent stents and no obstructive CAD cath 12/2019. No recent anginal symptoms. Continue medical management with aspirin  81 mg daily, metoprolol  succinate 50 mg daily, lisinopril  10 mg daily.  Refilled metoprolol  today. Continue Lipitor  and Repatha .   Hypertension: Controlled   Hyperlipidemia: Lipids very well controlled. Continue lipitor  and Repatha .   Type 2 DM: Uncontrolled with recent increase in A1c.  Discussed regular walking and reducing carbohydrate intake. Continue f/u w/PCP.     Meds ordered this encounter  Medications   metoprolol  succinate (TOPROL -XL) 50 MG 24 hr tablet    Sig: Take 1 tablet (50 mg total) by mouth daily. Take with or immediately following a meal.    Dispense:  90 tablet    Refill:  3     F/u in 1 year  Signed, Cody Das, MD

## 2023-06-24 ENCOUNTER — Other Ambulatory Visit: Payer: Self-pay | Admitting: Cardiology

## 2023-07-14 ENCOUNTER — Other Ambulatory Visit: Payer: Self-pay | Admitting: Cardiology

## 2023-07-25 ENCOUNTER — Other Ambulatory Visit: Payer: Self-pay | Admitting: Cardiology

## 2023-10-07 ENCOUNTER — Other Ambulatory Visit: Payer: Self-pay | Admitting: Cardiology

## 2023-10-07 MED ORDER — ATORVASTATIN CALCIUM 80 MG PO TABS: ORAL_TABLET | ORAL | 0 refills | Status: DC

## 2023-12-28 LAB — LAB REPORT - SCANNED
A1c: 7
Albumin, Urine POC: 2.05
Chlamydia, Swab/Urine, PCR: NOT DETECTED
Creatinine, POC: 188 mg/dL
EGFR: 82
HM HIV Screening: NEGATIVE
HM Hepatitis Screen: NEGATIVE
Microalb Creat Ratio: 10.9

## 2023-12-28 IMAGING — MG MM DIGITAL SCREENING BILAT W/ TOMO AND CAD
6 of 12 series · 6 of 36 positions shown · non-contrast
Comparison: Previous exam(s).

CLINICAL DATA: Screening.

EXAM:
DIGITAL SCREENING BILATERAL MAMMOGRAM WITH TOMOSYNTHESIS AND CAD
TECHNIQUE: Bilateral screening digital craniocaudal and mediolateral oblique
mammograms were obtained. Bilateral screening digital breast
tomosynthesis was performed. The images were evaluated with
computer-aided detection.

[R CC synth-2D (1 of 2)]
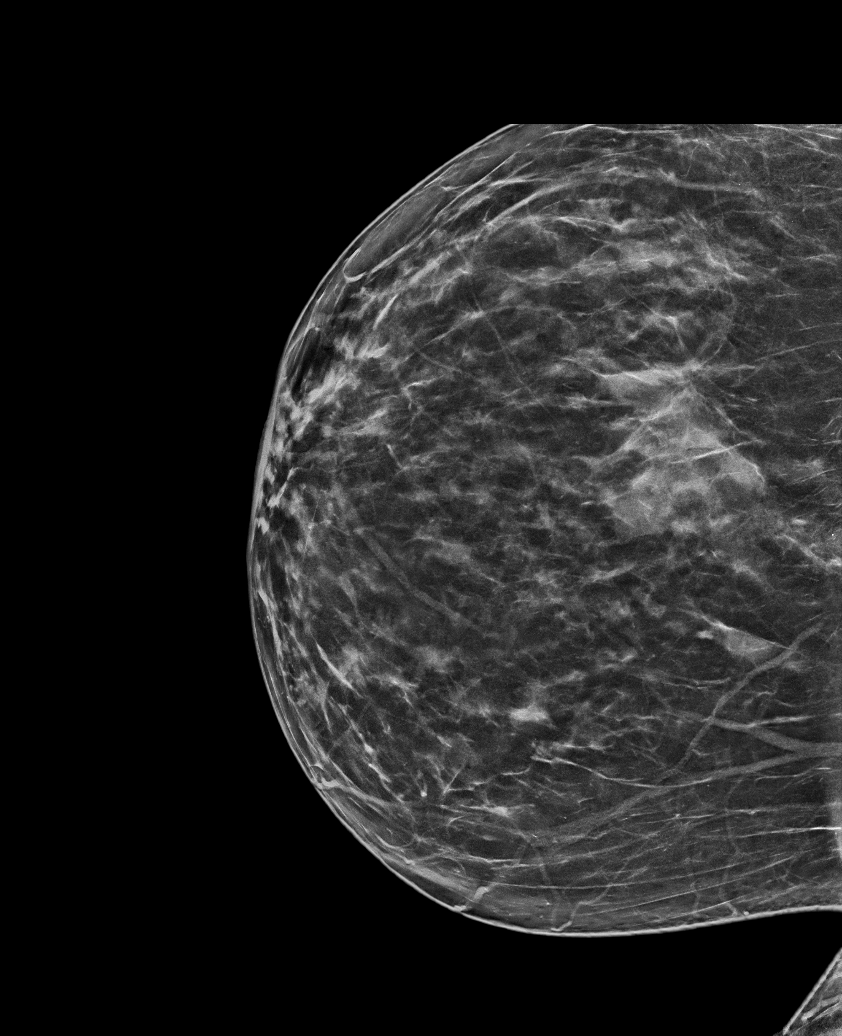

[R CC synth-2D (2 of 2)]
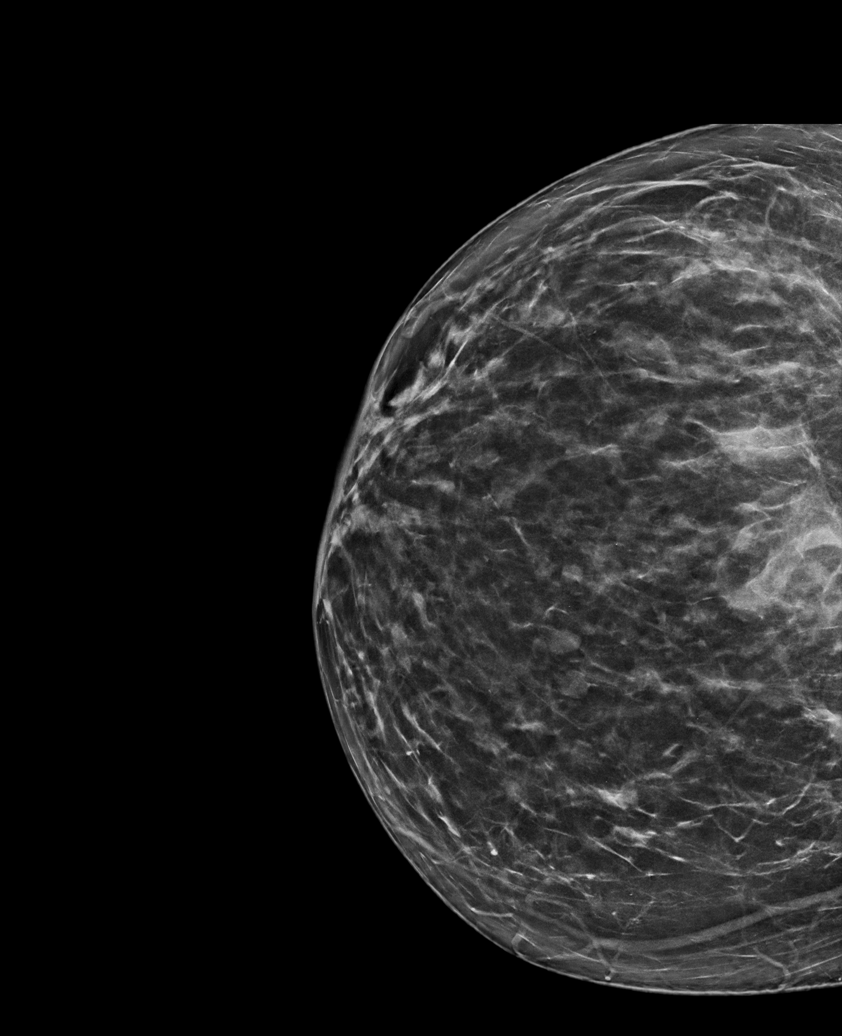

[R MLO synth-2D]
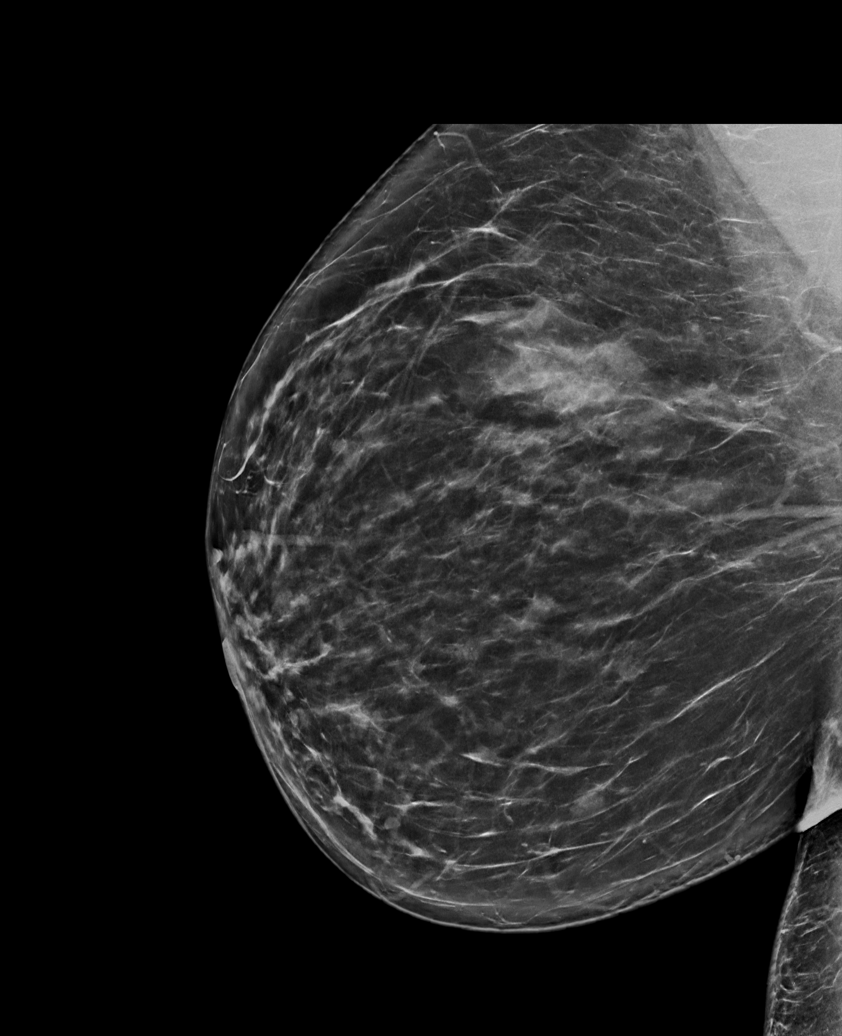

[L CC synth-2D]
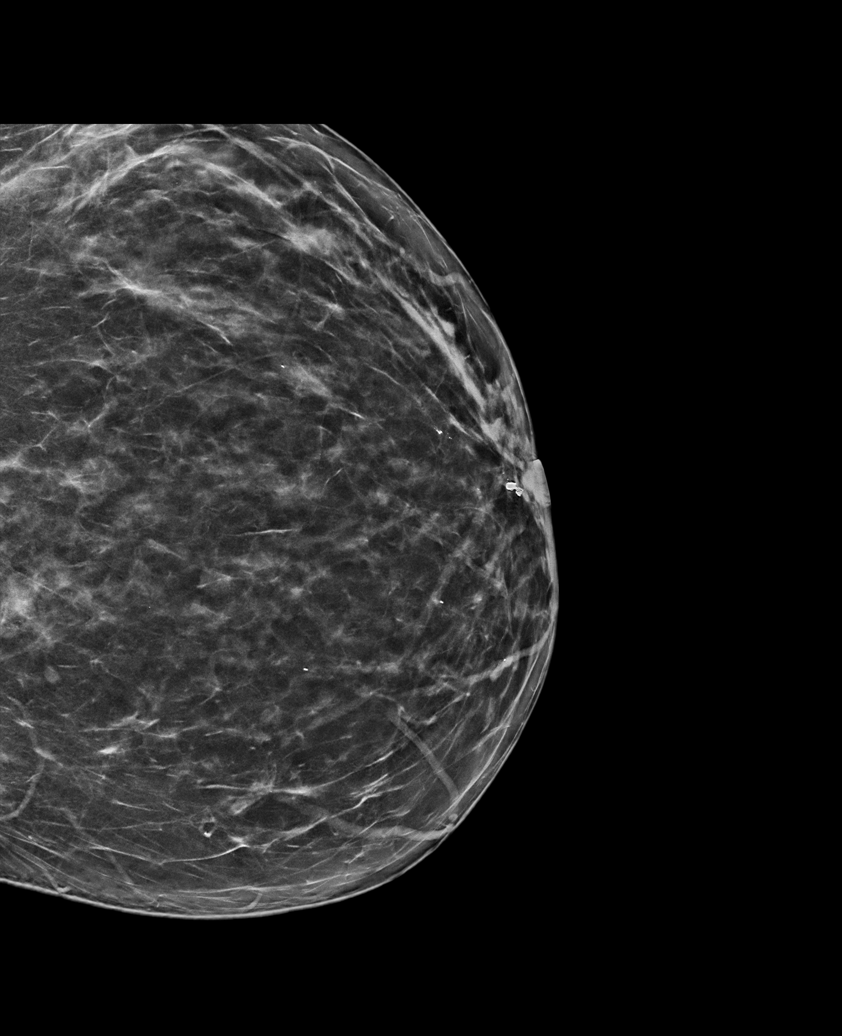

[R XCCL synth-2D]
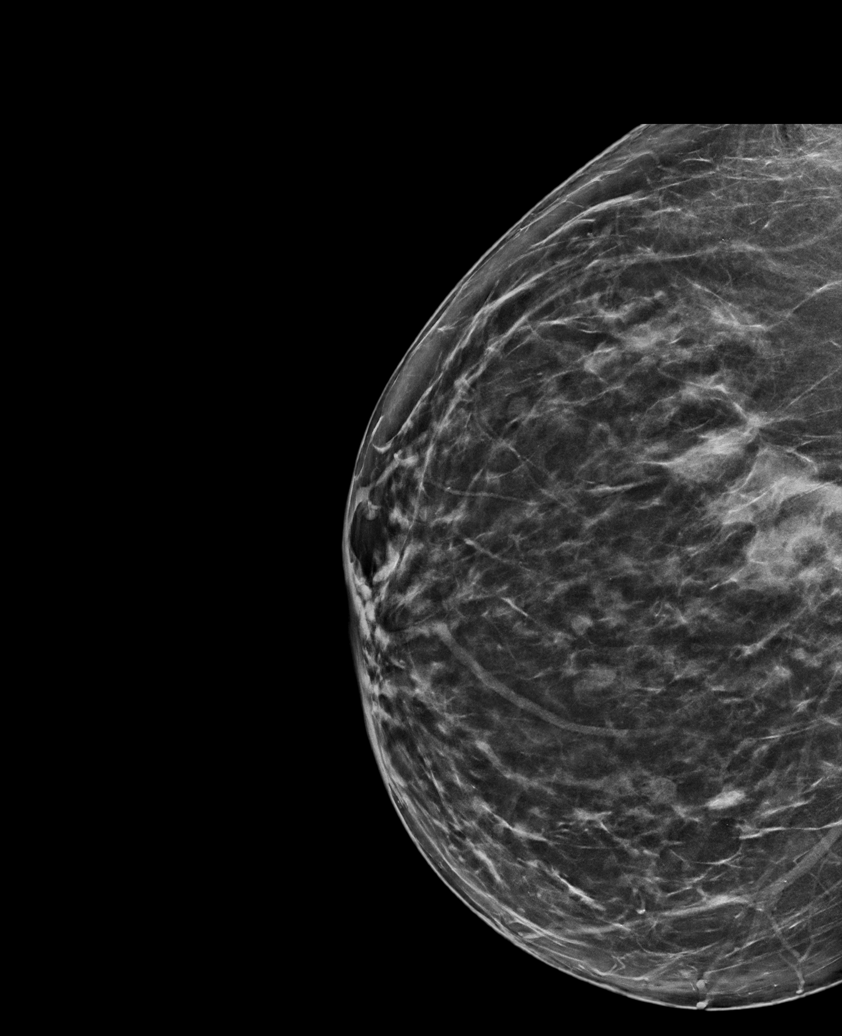

[L MLO synth-2D]
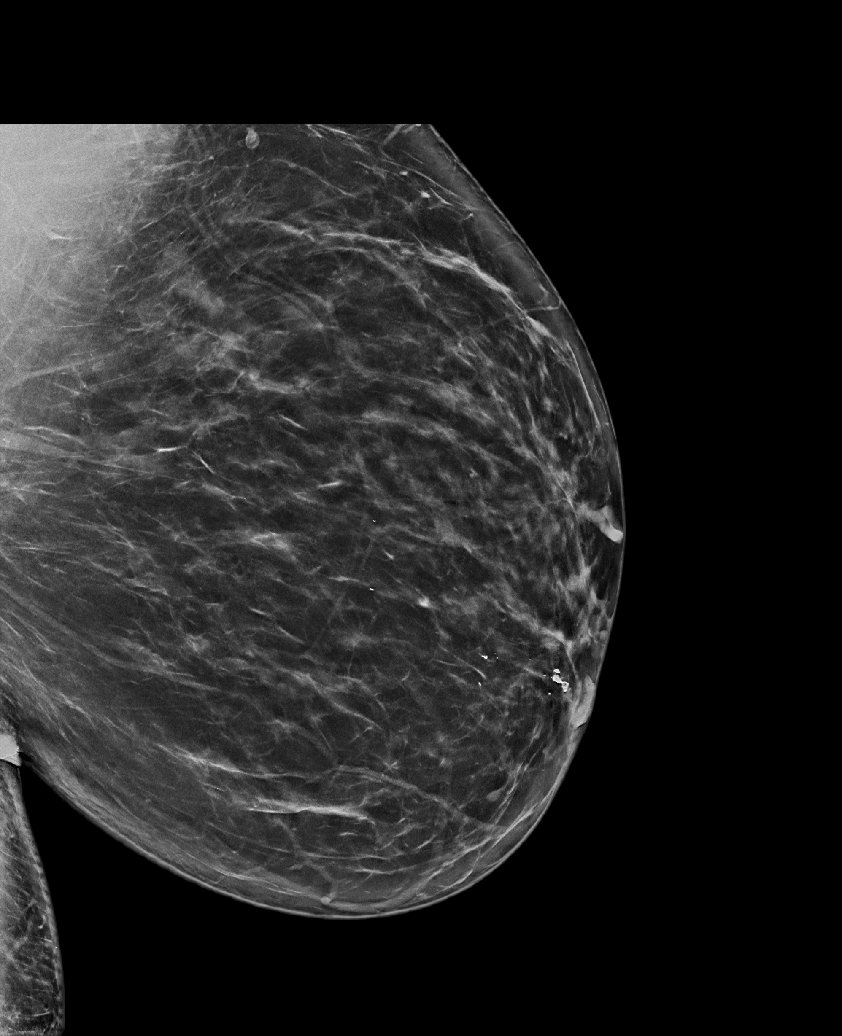

[6 of 36 positions shown; findings below may reference images not displayed]

ACR Breast Density Category b: There are scattered areas of
fibroglandular density.
FINDINGS: There are no findings suspicious for malignancy.
IMPRESSION: No mammographic evidence of malignancy. A result letter of this
screening mammogram will be mailed directly to the patient.

RECOMMENDATION:
Screening mammogram in one year. (Code:51-O-LD2)

BI-RADS CATEGORY  1: Negative.

## 2024-01-09 ENCOUNTER — Other Ambulatory Visit: Payer: Self-pay | Admitting: Cardiology
